# Patient Record
Sex: Male | Born: 1990 | Race: Black or African American | Hispanic: No | Marital: Single | Smoking: Never smoker
Health system: Southern US, Community
[De-identification: ages and names within clinical notes are randomized; demographics above are authoritative.]

## PROBLEM LIST (undated history)

## (undated) DIAGNOSIS — C801 Malignant (primary) neoplasm, unspecified: Secondary | ICD-10-CM

---
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
## 2013-06-30 ENCOUNTER — Emergency Department: Payer: Self-pay | Admitting: Emergency Medicine

## 2013-07-09 ENCOUNTER — Emergency Department: Payer: Self-pay

## 2013-07-09 LAB — COMPREHENSIVE METABOLIC PANEL
ALK PHOS: 74 U/L
Albumin: 3.1 g/dL — ABNORMAL LOW (ref 3.4–5.0)
Anion Gap: 5 — ABNORMAL LOW (ref 7–16)
BUN: 15 mg/dL (ref 7–18)
Bilirubin,Total: 1.2 mg/dL — ABNORMAL HIGH (ref 0.2–1.0)
CO2: 29 mmol/L (ref 21–32)
Calcium, Total: 9.2 mg/dL (ref 8.5–10.1)
Chloride: 101 mmol/L (ref 98–107)
Creatinine: 1.24 mg/dL (ref 0.60–1.30)
EGFR (African American): >60
EGFR (Non-African Amer.): >60
Glucose: 108 mg/dL — ABNORMAL HIGH (ref 65–99)
Osmolality: 271 (ref 275–301)
Potassium: 3.5 mmol/L (ref 3.5–5.1)
SGOT(AST): 28 U/L (ref 15–37)
SGPT (ALT): 24 U/L (ref 12–78)
SODIUM: 135 mmol/L — AB (ref 136–145)
Total Protein: 8.5 g/dL — ABNORMAL HIGH (ref 6.4–8.2)

## 2013-07-09 LAB — CBC
HCT: 44.2 % (ref 40.0–52.0)
HGB: 14.8 g/dL (ref 13.0–18.0)
MCH: 34 pg (ref 26.0–34.0)
MCHC: 33.4 g/dL (ref 32.0–36.0)
MCV: 102 fL — ABNORMAL HIGH (ref 80–100)
PLATELETS: 107 10*3/uL — AB (ref 150–440)
RBC: 4.35 10*6/uL — AB (ref 4.40–5.90)
RDW: 12.9 % (ref 11.5–14.5)
WBC: 16.3 10*3/uL — AB (ref 3.8–10.6)

## 2013-07-09 LAB — URINALYSIS, COMPLETE
BILIRUBIN, UR: NEGATIVE
Glucose,UR: NEGATIVE mg/dL (ref 0–75)
KETONE: NEGATIVE
Nitrite: POSITIVE
PH: 6 (ref 4.5–8.0)
RBC,UR: NONE SEEN /HPF (ref 0–5)
Specific Gravity: 1.009 (ref 1.003–1.030)
WBC UR: 33 /HPF (ref 0–5)

## 2013-07-09 LAB — GC/CHLAMYDIA PROBE AMP

## 2013-07-09 LAB — LIPASE, BLOOD: LIPASE: 46 U/L — AB (ref 73–393)

## 2013-07-11 LAB — BETA STREP CULTURE(ARMC)

## 2013-07-11 LAB — URINE CULTURE

## 2013-07-13 ENCOUNTER — Emergency Department: Payer: Self-pay | Admitting: Emergency Medicine

## 2013-07-13 LAB — CBC
HCT: 41.6 % (ref 40.0–52.0)
HGB: 13.5 g/dL (ref 13.0–18.0)
MCH: 32.9 pg (ref 26.0–34.0)
MCHC: 32.4 g/dL (ref 32.0–36.0)
MCV: 102 fL — ABNORMAL HIGH (ref 80–100)
PLATELETS: 164 10*3/uL (ref 150–440)
RBC: 4.09 10*6/uL — ABNORMAL LOW (ref 4.40–5.90)
RDW: 13.3 % (ref 11.5–14.5)
WBC: 7.5 10*3/uL (ref 3.8–10.6)

## 2013-07-13 LAB — COMPREHENSIVE METABOLIC PANEL
ALBUMIN: 2.7 g/dL — AB (ref 3.4–5.0)
ANION GAP: 6 — AB (ref 7–16)
Alkaline Phosphatase: 79 U/L
BUN: 7 mg/dL (ref 7–18)
Bilirubin,Total: 0.9 mg/dL (ref 0.2–1.0)
Calcium, Total: 9 mg/dL (ref 8.5–10.1)
Chloride: 102 mmol/L (ref 98–107)
Co2: 30 mmol/L (ref 21–32)
Creatinine: 1.07 mg/dL (ref 0.60–1.30)
EGFR (African American): >60
EGFR (Non-African Amer.): >60
Glucose: 79 mg/dL (ref 65–99)
Osmolality: 273 (ref 275–301)
Potassium: 3.1 mmol/L — ABNORMAL LOW (ref 3.5–5.1)
SGOT(AST): 43 U/L — ABNORMAL HIGH (ref 15–37)
SGPT (ALT): 37 U/L (ref 12–78)
SODIUM: 138 mmol/L (ref 136–145)
Total Protein: 8.2 g/dL (ref 6.4–8.2)

## 2013-07-13 LAB — URINALYSIS, COMPLETE
BLOOD: NEGATIVE
Bacteria: NONE SEEN
Bilirubin,UR: NEGATIVE
Glucose,UR: NEGATIVE mg/dL (ref 0–75)
KETONE: NEGATIVE
Leukocyte Esterase: NEGATIVE
Nitrite: NEGATIVE
Ph: 6 (ref 4.5–8.0)
Protein: NEGATIVE
RBC,UR: 1 /HPF (ref 0–5)
SQUAMOUS EPITHELIAL: NONE SEEN
Specific Gravity: 1.015 (ref 1.003–1.030)

## 2013-12-24 ENCOUNTER — Emergency Department: Payer: Self-pay | Admitting: Emergency Medicine

## 2014-02-05 ENCOUNTER — Emergency Department: Payer: Self-pay | Admitting: Emergency Medicine

## 2016-02-23 IMAGING — CT CT ABD-PELV W/ CM
2 of 4 series · 16 of 46 positions shown, 18 images · IV contrast (isovue)
Comparison: None.

CLINICAL DATA: Right lower quadrant pain and right flank pain for 3
days with elevated white blood cell count.

EXAM:
CT ABDOMEN AND PELVIS WITH CONTRAST
TECHNIQUE: Multidetector CT imaging of the abdomen and pelvis was performed
using the standard protocol following bolus administration of
intravenous contrast.
CONTRAST:  100 mL Isovue 370 IV

[Series 2: routine abd pel with · axial · 0.66mm/px · z∈[-1595,-1155]mm · 13 of 96 slices shown, 15 images]
[im 4/96  soft-tissue]
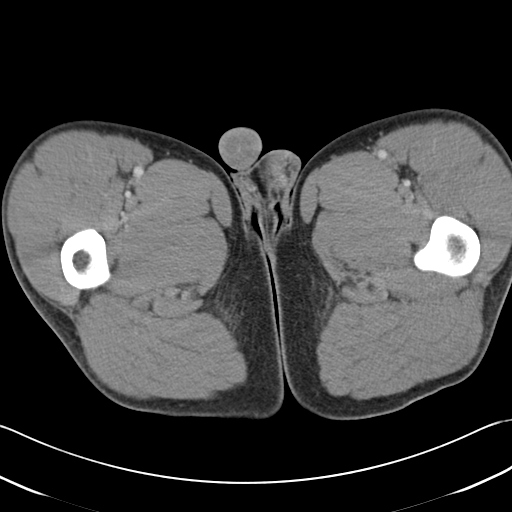
[im 4/96  bone]
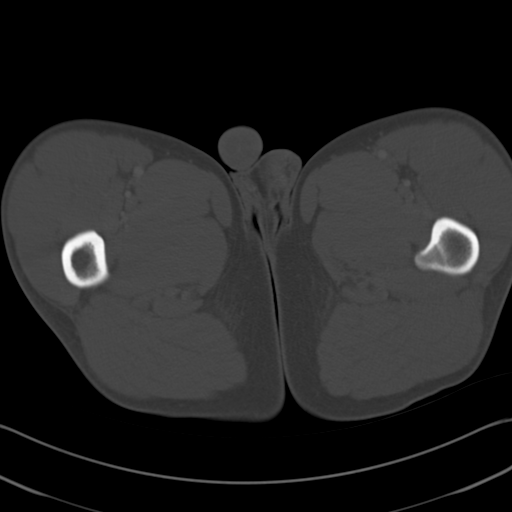
[im 12/96  soft-tissue]
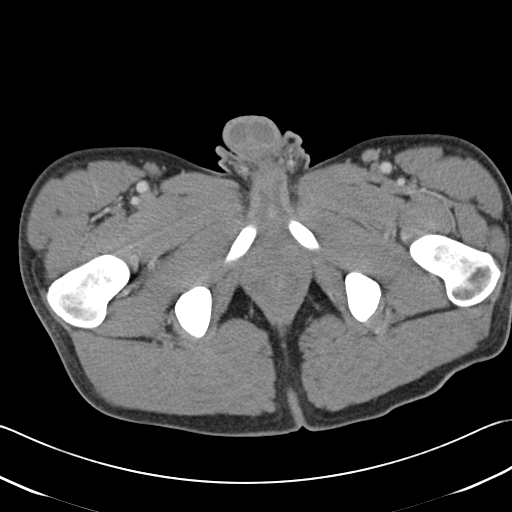
[im 20/96  soft-tissue]
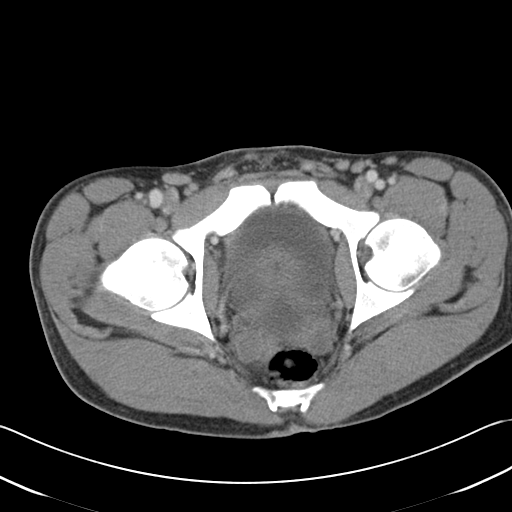
[im 27/96  soft-tissue]
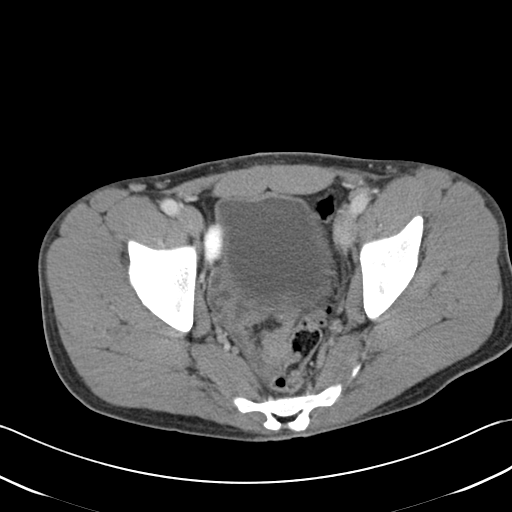
[im 35/96  soft-tissue]
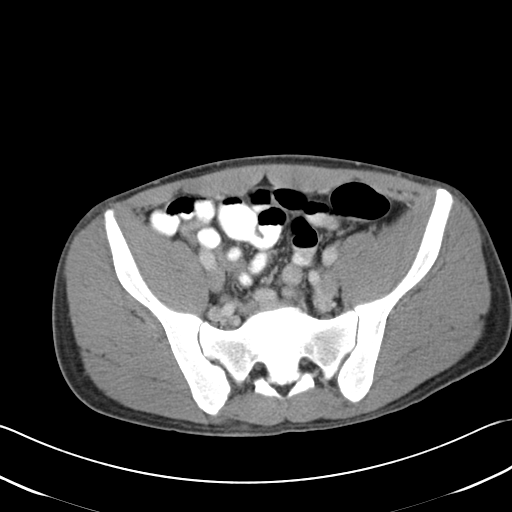
[im 42/96  soft-tissue]
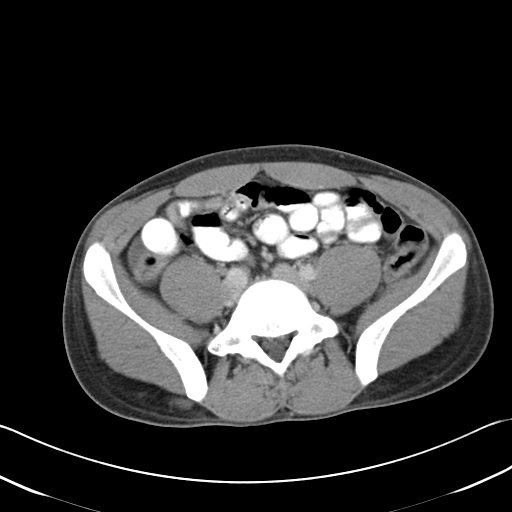
[im 50/96  soft-tissue]
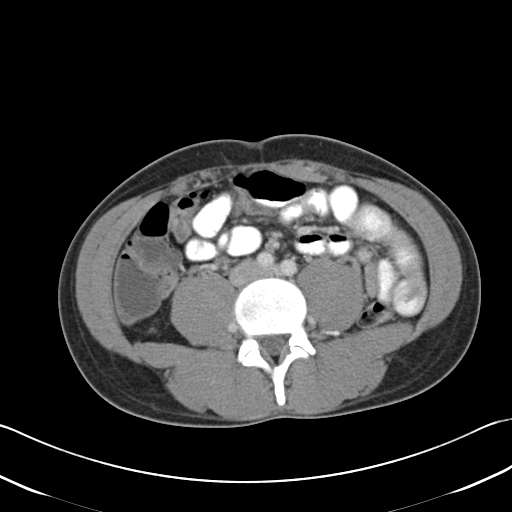
[im 54/96  soft-tissue]
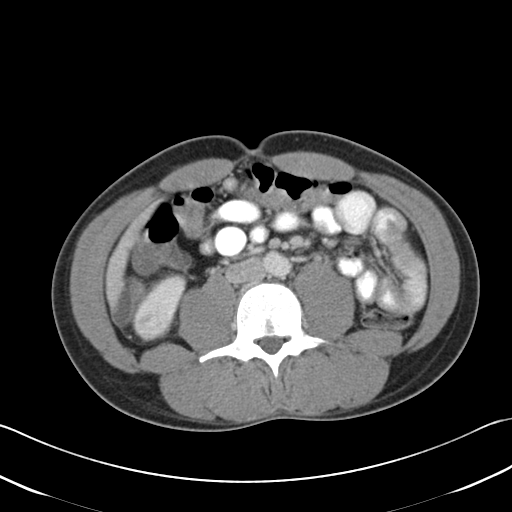
[im 61/96  soft-tissue]
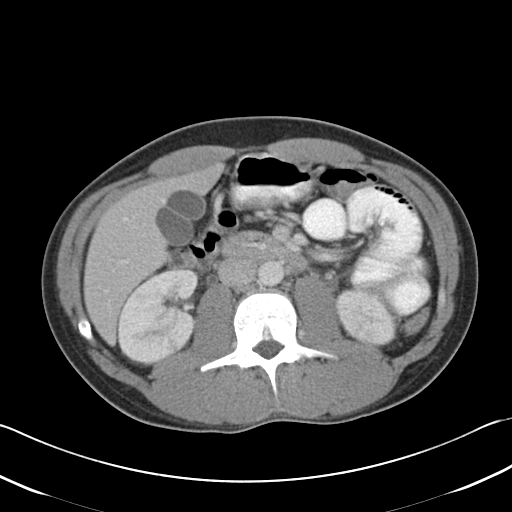
[im 61/96  bone]
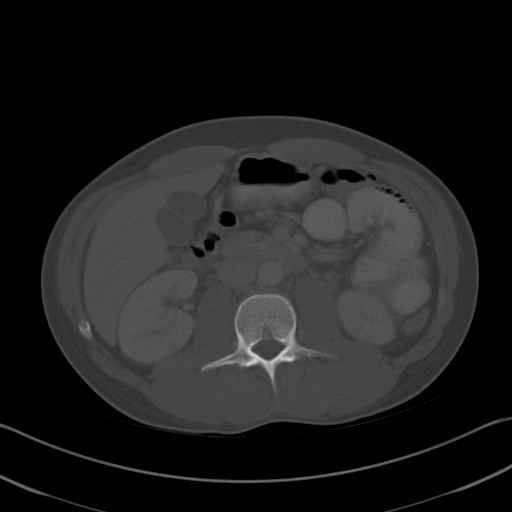
[im 69/96  soft-tissue]
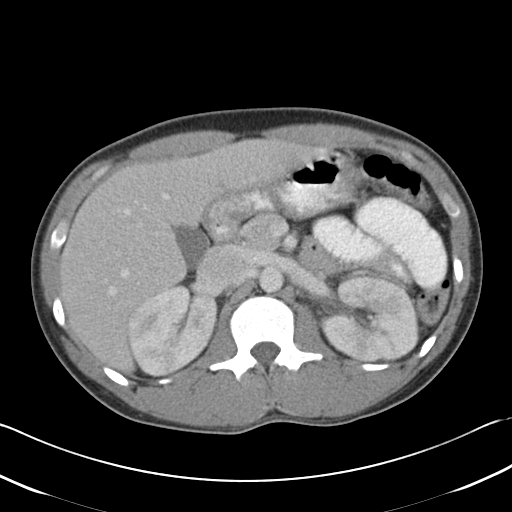
[im 77/96  soft-tissue]
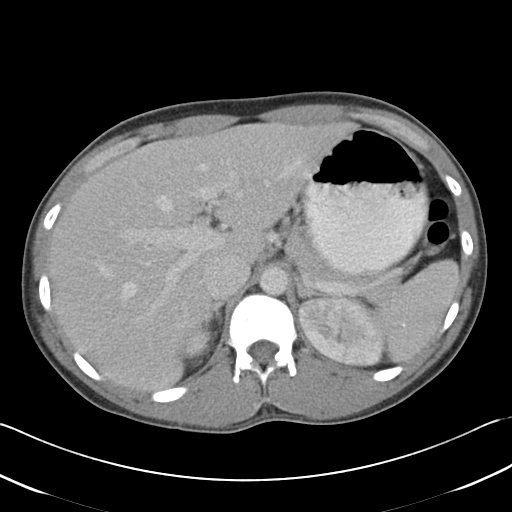
[im 84/96  soft-tissue]
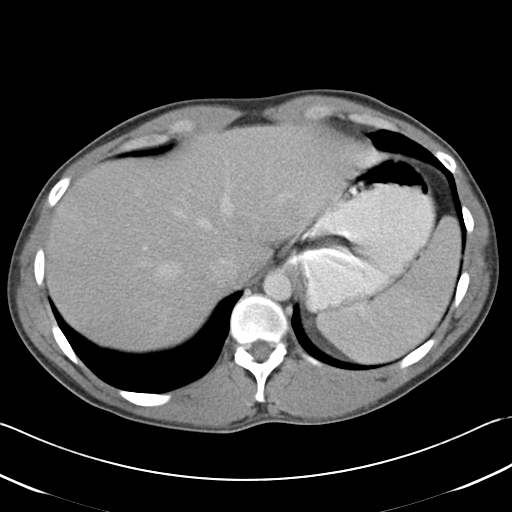
[im 92/96  soft-tissue]
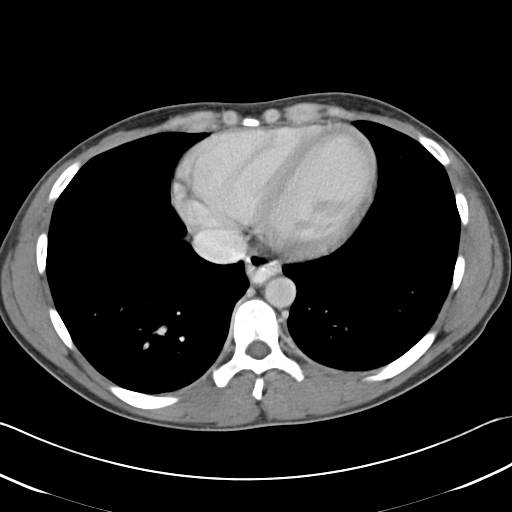

[Series 5: cor routine abd pel with · coronal · 0.72mm/px · 3 of 114 slices shown]
[im 38/114  soft-tissue]
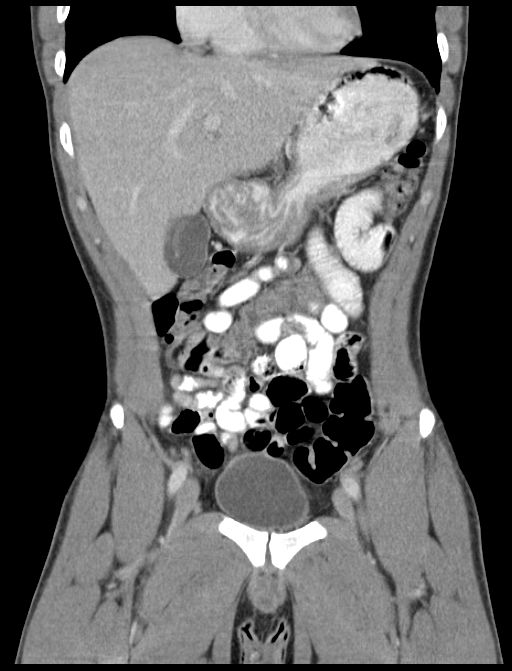
[im 51/114  soft-tissue]
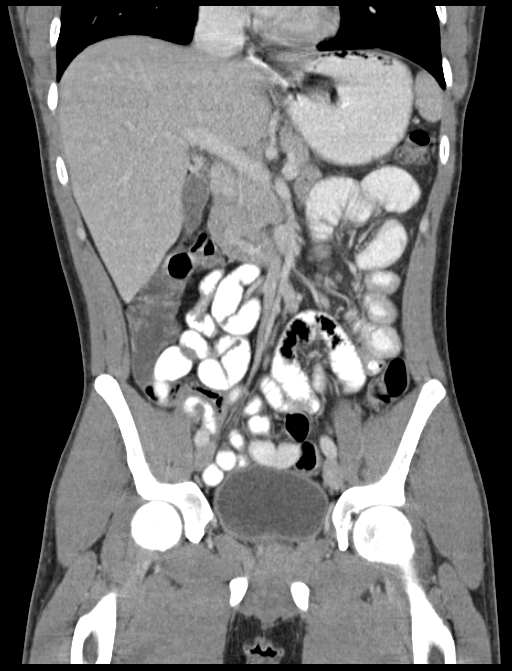
[im 63/114  soft-tissue]
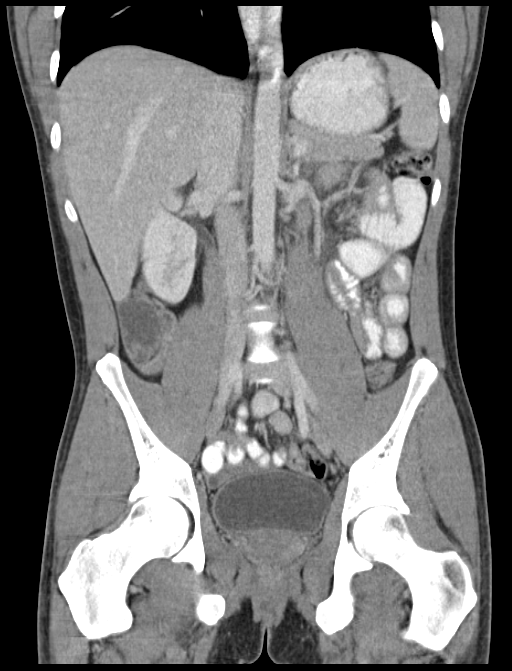

[16 of 46 positions shown; findings below may reference images not displayed]

FINDINGS: Lung bases are normal.

Abdominal images demonstrate a calcified granuloma over the spleen.
The liver, gallbladder, pancreas and adrenal glands are normal.
Appendix is is normal in appearance although extends superiorly with
tip posterior to the gallbladder. There is subtle nonspecific wall
thickening involving a few small bowel loops in the left mid abdomen
with subtle dilatation of the small bowel loops immediately proximal
to this area. There is no free fluid or focal inflammatory change.

The kidneys are normal in size without evidence of hydronephrosis or
nephrolithiasis. There is patchy ill-defined and streaky
low-attenuation of the renal cortex bilaterally which can be seen
due to infection/pyelonephritis. No significant perinephric
inflammation or fluid. Ureters are unremarkable.

Pelvic images demonstrate a small amount of free fluid and are
otherwise unremarkable. Remaining bones soft tissues are within
normal.
IMPRESSION: Normal size kidneys without hydronephrosis or nephrolithiasis. There
are ill-defined regions of low attenuation and streaky hypodensity
within the renal cortex bilaterally which may be due to infection/
pyelonephritis. No significant perinephric inflammation or fluid.

Subtle wall thickening of several small bowel loops in the left mid
abdomen with mild prominence of the small bowel loops immediately
proximal to this region. These findings may be secondary to ileus
versus mild regional enteritis of inflammatory or infectious
etiology.

Minimal nonspecific free fluid in the pelvis.

## 2017-01-06 DIAGNOSIS — D75A Glucose-6-phosphate dehydrogenase (G6PD) deficiency without anemia: Secondary | ICD-10-CM | POA: Insufficient documentation

## 2017-02-13 DIAGNOSIS — G894 Chronic pain syndrome: Secondary | ICD-10-CM | POA: Diagnosis present

## 2021-01-15 DIAGNOSIS — I10 Essential (primary) hypertension: Secondary | ICD-10-CM | POA: Insufficient documentation

## 2022-05-07 ENCOUNTER — Emergency Department: Payer: Medicaid - Out of State

## 2022-05-07 ENCOUNTER — Encounter: Payer: Self-pay | Admitting: Emergency Medicine

## 2022-05-07 ENCOUNTER — Inpatient Hospital Stay
Admission: EM | Admit: 2022-05-07 | Discharge: 2022-05-09 | DRG: 975 | Disposition: A | Discharge status: Home / Self Care | Payer: Medicaid - Out of State | Attending: Internal Medicine | Admitting: Internal Medicine

## 2022-05-07 ENCOUNTER — Other Ambulatory Visit: Payer: Self-pay

## 2022-05-07 DIAGNOSIS — E43 Unspecified severe protein-calorie malnutrition: Secondary | ICD-10-CM | POA: Diagnosis present

## 2022-05-07 DIAGNOSIS — R6510 Systemic inflammatory response syndrome (SIRS) of non-infectious origin without acute organ dysfunction: Secondary | ICD-10-CM | POA: Diagnosis present

## 2022-05-07 DIAGNOSIS — R7989 Other specified abnormal findings of blood chemistry: Secondary | ICD-10-CM | POA: Diagnosis present

## 2022-05-07 DIAGNOSIS — E871 Hypo-osmolality and hyponatremia: Secondary | ICD-10-CM | POA: Diagnosis present

## 2022-05-07 DIAGNOSIS — Z21 Asymptomatic human immunodeficiency virus [HIV] infection status: Secondary | ICD-10-CM | POA: Diagnosis present

## 2022-05-07 DIAGNOSIS — Z681 Body mass index (BMI) 19 or less, adult: Secondary | ICD-10-CM

## 2022-05-07 DIAGNOSIS — J189 Pneumonia, unspecified organism: Secondary | ICD-10-CM | POA: Diagnosis present

## 2022-05-07 DIAGNOSIS — E46 Unspecified protein-calorie malnutrition: Secondary | ICD-10-CM | POA: Diagnosis present

## 2022-05-07 DIAGNOSIS — R000 Tachycardia, unspecified: Secondary | ICD-10-CM

## 2022-05-07 DIAGNOSIS — A419 Sepsis, unspecified organism: Principal | ICD-10-CM | POA: Diagnosis present

## 2022-05-07 DIAGNOSIS — R079 Chest pain, unspecified: Secondary | ICD-10-CM

## 2022-05-07 DIAGNOSIS — C7951 Secondary malignant neoplasm of bone: Secondary | ICD-10-CM | POA: Diagnosis present

## 2022-05-07 DIAGNOSIS — B20 Human immunodeficiency virus [HIV] disease: Secondary | ICD-10-CM | POA: Diagnosis present

## 2022-05-07 DIAGNOSIS — I4510 Unspecified right bundle-branch block: Secondary | ICD-10-CM | POA: Diagnosis present

## 2022-05-07 DIAGNOSIS — C469 Kaposi's sarcoma, unspecified: Secondary | ICD-10-CM | POA: Diagnosis present

## 2022-05-07 DIAGNOSIS — E440 Moderate protein-calorie malnutrition: Secondary | ICD-10-CM | POA: Diagnosis present

## 2022-05-07 DIAGNOSIS — Z1152 Encounter for screening for COVID-19: Secondary | ICD-10-CM

## 2022-05-07 DIAGNOSIS — Z8701 Personal history of pneumonia (recurrent): Secondary | ICD-10-CM

## 2022-05-07 LAB — CBC WITH DIFFERENTIAL/PLATELET
Abs Immature Granulocytes: 0.42 10*3/uL — ABNORMAL HIGH (ref 0.00–0.07)
Basophils Absolute: 0 10*3/uL (ref 0.0–0.1)
Basophils Relative: 0 %
Eosinophils Absolute: 0 10*3/uL (ref 0.0–0.5)
Eosinophils Relative: 0 %
HCT: 43.9 % (ref 39.0–52.0)
Hemoglobin: 13 g/dL (ref 13.0–17.0)
Immature Granulocytes: 5 %
Lymphocytes Relative: 19 %
Lymphs Abs: 1.8 10*3/uL (ref 0.7–4.0)
MCH: 26.2 pg (ref 26.0–34.0)
MCHC: 29.6 g/dL — ABNORMAL LOW (ref 30.0–36.0)
MCV: 88.3 fL (ref 80.0–100.0)
Monocytes Absolute: 0.6 10*3/uL (ref 0.1–1.0)
Monocytes Relative: 7 %
Neutro Abs: 6.5 10*3/uL (ref 1.7–7.7)
Neutrophils Relative %: 69 %
Platelets: 148 10*3/uL — ABNORMAL LOW (ref 150–400)
RBC: 4.97 MIL/uL (ref 4.22–5.81)
RDW: 21.8 % — ABNORMAL HIGH (ref 11.5–15.5)
Smear Review: NORMAL
WBC: 9.4 10*3/uL (ref 4.0–10.5)
nRBC: 0.7 % — ABNORMAL HIGH (ref 0.0–0.2)

## 2022-05-07 LAB — URINALYSIS, ROUTINE W REFLEX MICROSCOPIC
Bacteria, UA: NONE SEEN
Glucose, UA: NEGATIVE mg/dL
Hgb urine dipstick: NEGATIVE
Ketones, ur: 20 mg/dL — AB
Leukocytes,Ua: NEGATIVE
Nitrite: NEGATIVE
Protein, ur: 100 mg/dL — AB
Specific Gravity, Urine: 1.027 (ref 1.005–1.030)
pH: 5 (ref 5.0–8.0)

## 2022-05-07 LAB — COMPREHENSIVE METABOLIC PANEL
ALT: 32 U/L (ref 0–44)
AST: 121 U/L — ABNORMAL HIGH (ref 15–41)
Albumin: 2.7 g/dL — ABNORMAL LOW (ref 3.5–5.0)
Alkaline Phosphatase: 216 U/L — ABNORMAL HIGH (ref 38–126)
Anion gap: 12 (ref 5–15)
BUN: 17 mg/dL (ref 6–20)
CO2: 23 mmol/L (ref 22–32)
Calcium: 8.1 mg/dL — ABNORMAL LOW (ref 8.9–10.3)
Chloride: 95 mmol/L — ABNORMAL LOW (ref 98–111)
Creatinine, Ser: 1.05 mg/dL (ref 0.61–1.24)
GFR, Estimated: >60 mL/min (ref >60)
Glucose, Bld: 89 mg/dL (ref 70–99)
Potassium: 4 mmol/L (ref 3.5–5.1)
Sodium: 130 mmol/L — ABNORMAL LOW (ref 135–145)
Total Bilirubin: 1.5 mg/dL — ABNORMAL HIGH (ref 0.3–1.2)
Total Protein: 11.4 g/dL — ABNORMAL HIGH (ref 6.5–8.1)

## 2022-05-07 LAB — RESP PANEL BY RT-PCR (RSV, FLU A&B, COVID)  RVPGX2
Influenza A by PCR: NEGATIVE
Influenza B by PCR: NEGATIVE
Resp Syncytial Virus by PCR: NEGATIVE
SARS Coronavirus 2 by RT PCR: NEGATIVE

## 2022-05-07 LAB — TROPONIN I (HIGH SENSITIVITY)
Troponin I (High Sensitivity): 7 ng/L (ref <18)
Troponin I (High Sensitivity): 6 ng/L (ref <18)

## 2022-05-07 LAB — LACTIC ACID, PLASMA: Lactic Acid, Venous: 1.8 mmol/L (ref 0.5–1.9)

## 2022-05-07 LAB — PROCALCITONIN: Procalcitonin: 0.32 ng/mL

## 2022-05-07 LAB — GROUP A STREP BY PCR: Group A Strep by PCR: NOT DETECTED

## 2022-05-07 MED ORDER — POTASSIUM CHLORIDE CRYS ER 20 MEQ PO TBCR
20.0000 meq | EXTENDED_RELEASE_TABLET | Freq: Once | ORAL | Status: AC
  Administered 2022-05-08: 20 meq via ORAL
  Filled 2022-05-07: qty 1

## 2022-05-07 MED ORDER — MAGNESIUM OXIDE -MG SUPPLEMENT 400 (240 MG) MG PO TABS
400.0000 mg | ORAL_TABLET | Freq: Every day | ORAL | Status: DC
  Administered 2022-05-08 – 2022-05-09 (×2): 400 mg via ORAL
  Filled 2022-05-07 (×2): qty 1

## 2022-05-07 MED ORDER — ENOXAPARIN SODIUM 40 MG/0.4ML IJ SOSY
40.0000 mg | PREFILLED_SYRINGE | INTRAMUSCULAR | Status: DC
  Administered 2022-05-08 – 2022-05-09 (×2): 40 mg via SUBCUTANEOUS
  Filled 2022-05-07 (×2): qty 0.4

## 2022-05-07 MED ORDER — MORPHINE SULFATE (PF) 2 MG/ML IV SOLN
2.0000 mg | INTRAVENOUS | Status: DC | PRN
  Administered 2022-05-08 (×5): 2 mg via INTRAVENOUS
  Filled 2022-05-07 (×5): qty 1

## 2022-05-07 MED ORDER — ACETAMINOPHEN 325 MG PO TABS: 650.0000 mg | ORAL_TABLET | Freq: Four times a day (QID) | ORAL | Status: DC | PRN

## 2022-05-07 MED ORDER — ONDANSETRON HCL 4 MG PO TABS: 4.0000 mg | ORAL_TABLET | Freq: Four times a day (QID) | ORAL | Status: DC | PRN

## 2022-05-07 MED ORDER — ACETAMINOPHEN 650 MG RE SUPP: 650.0000 mg | Freq: Four times a day (QID) | RECTAL | Status: DC | PRN

## 2022-05-07 MED ORDER — SODIUM CHLORIDE 0.9 % IV BOLUS
1000.0000 mL | Freq: Once | INTRAVENOUS | Status: AC
  Administered 2022-05-07: 1000 mL via INTRAVENOUS

## 2022-05-07 MED ORDER — ALBUTEROL SULFATE (2.5 MG/3ML) 0.083% IN NEBU: 2.5000 mg | INHALATION_SOLUTION | RESPIRATORY_TRACT | Status: DC | PRN

## 2022-05-07 MED ORDER — IOHEXOL 350 MG/ML SOLN
75.0000 mL | Freq: Once | INTRAVENOUS | Status: AC | PRN
  Administered 2022-05-07: 75 mL via INTRAVENOUS

## 2022-05-07 MED ORDER — KETOROLAC TROMETHAMINE 30 MG/ML IJ SOLN
30.0000 mg | Freq: Four times a day (QID) | INTRAMUSCULAR | Status: DC | PRN
  Administered 2022-05-08: 30 mg via INTRAVENOUS
  Filled 2022-05-07: qty 1

## 2022-05-07 MED ORDER — BICTEGRAVIR-EMTRICITAB-TENOFOV 50-200-25 MG PO TABS
1.0000 | ORAL_TABLET | Freq: Every day | ORAL | Status: DC
  Administered 2022-05-08 – 2022-05-09 (×2): 1 via ORAL
  Filled 2022-05-07 (×2): qty 1

## 2022-05-07 MED ORDER — HYDROCODONE-ACETAMINOPHEN 5-325 MG PO TABS
1.0000 | ORAL_TABLET | ORAL | Status: DC | PRN
  Administered 2022-05-08 – 2022-05-09 (×2): 2 via ORAL
  Filled 2022-05-07 (×3): qty 2

## 2022-05-07 MED ORDER — MORPHINE SULFATE (PF) 4 MG/ML IV SOLN
4.0000 mg | Freq: Once | INTRAVENOUS | Status: AC
  Administered 2022-05-07: 4 mg via INTRAVENOUS
  Filled 2022-05-07: qty 1

## 2022-05-07 MED ORDER — SODIUM CHLORIDE 0.9 % IV SOLN: INTRAVENOUS | Status: AC

## 2022-05-07 MED ORDER — LIDOCAINE 5 % EX PTCH
1.0000 | MEDICATED_PATCH | CUTANEOUS | Status: DC
  Administered 2022-05-08 (×2): 1 via TRANSDERMAL
  Filled 2022-05-07 (×2): qty 1

## 2022-05-07 MED ORDER — ONDANSETRON HCL 4 MG/2ML IJ SOLN
4.0000 mg | Freq: Four times a day (QID) | INTRAMUSCULAR | Status: DC | PRN
  Administered 2022-05-08: 4 mg via INTRAVENOUS
  Filled 2022-05-07: qty 2

## 2022-05-07 NOTE — Assessment & Plan Note (Addendum)
Improving.  RUQ ultrasound with no evidence of cholecystitis, mild heterogenicity of the hepatic parenchyma, question periportal edema. Acute hepatitis panel negative

## 2022-05-07 NOTE — H&P (Incomplete)
History and Physical    Patient: Warren Farrell [id number] DOB: Apr 02, 1990 DOA: 05/07/2022 DOS: the patient was seen and examined on 05/07/2022 PCP: Pcp, No  Patient coming from: Home  Chief Complaint:  Chief Complaint  Patient presents with   Chest Pain    HPI: Warren Farrell is a 32 y.o. male with medical history significant for HIV diagnosed in August 2018 diagnosed a few months later with Kaposi's sarcoma and biopsies of skin lesions on thigh and palatal masses currently receiving treatment in [place], records unavailable in [place], who presents to the ED with left-sided chest pain, onset 2 days prior associated with sore throat.  He denies cough, shortness of breath, fever or chills, nausea or vomiting he endorses intermittent upper abdominal pain. ED course and data review: Tmax 99 and tachycardic to the 150s improving only to 122 with hydration, tachypneic To 32 with O2 sat high 90s to 100% on room air.  Respiratory viral panel negative.  CBC and CMP: AST 121, alk phos 216 total bili 1.5 , mild hyponatremia of 130.  Urinalysis unremarkable.  Procalcitonin pending.  Strep test negative. EKG, personally viewed and interpreted showing sinus tachycardia at 158 with incomplete RBBB. CTA chest negative for PE or aortic dissection but showing extensive chronic appearing findings which could be postinfectious or postinflammatory scarring, mild pleural effusion, scattered lytic lesions concerning for metastatic disease, as outlined below: IMPRESSION: 1. Negative for acute pulmonary embolus or aortic dissection. 2. Extensive upper lobe predominant bronchiectasis with upper lobe blebs or emphysema. Fairly widespread irregular and bandlike densities within the upper lobes possibly secondary to post infectious or post inflammatory scarring. There are however additional areas of bilateral irregular ground-glass density and nodularity with suspicion of peribronchovascular  nodularity in the right middle and bilateral lower lobes; findings could be infectious, inflammatory, or malignant in etiology (?history of Kaposi's sarcoma). Mild hyperdense pleural effusions versus pleural metastatic disease. 3. Dominant subcarinal mass suspicious for metastatic disease versus lymphoproliferative disease/lymphoma. 4. Scattered lytic lesions in the sternum and spine concerning for metastatic disease  Patient treated with an IV fluid bolus given morphine for pain.  Hospitalist consulted for admission.   Review of Systems: As mentioned in the history of present illness. All other systems reviewed and are negative.  History reviewed. No pertinent past medical history. History reviewed. No pertinent surgical history. Social History:  reports that he has never smoked. He has never used smokeless tobacco. No history on file for alcohol use and drug use.  No Known Allergies  History reviewed. No pertinent family history.  Prior to Admission medications   Not on File    Physical Exam: Vitals:   05/07/22 1936 05/07/22 2031 05/07/22 2130 05/07/22 2300  BP: 108/77 114/85 113/75 113/74  Pulse: (!) 108 (!) 113 (!) 122 95  Resp: 19 (!) 21 (!) 32 (!) 23  Temp: 99 F (37.2 C)     TempSrc: Oral     SpO2: 100% 100% 100% 100%  Weight:      Height:       Physical Exam  Labs on Admission: I have personally reviewed following labs and imaging studies  CBC: Recent Labs  Lab 05/07/22 1632  WBC 9.4  NEUTROABS 6.5  HGB 13.0  HCT 43.9  MCV 88.3  PLT 123456*   Basic Metabolic Panel: Recent Labs  Lab 05/07/22 1632  NA 130*  K 4.0  CL 95*  CO2 23  GLUCOSE 89  BUN 17  CREATININE 1.05  CALCIUM 8.1*   GFR: Estimated Creatinine Clearance: 81 mL/min (by C-G formula based on SCr of 1.05 mg/dL). Liver Function Tests: Recent Labs  Lab 05/07/22 1632  AST 121*  ALT 32  ALKPHOS 216*  BILITOT 1.5*  PROT 11.4*  ALBUMIN 2.7*   No results for input(s): "LIPASE",  "AMYLASE" in the last 168 hours. No results for input(s): "AMMONIA" in the last 168 hours. Coagulation Profile: No results for input(s): "INR", "PROTIME" in the last 168 hours. Cardiac Enzymes: No results for input(s): "CKTOTAL", "CKMB", "CKMBINDEX", "TROPONINI" in the last 168 hours. BNP (last 3 results) No results for input(s): "PROBNP" in the last 8760 hours. HbA1C: No results for input(s): "HGBA1C" in the last 72 hours. CBG: No results for input(s): "GLUCAP" in the last 168 hours. Lipid Profile: No results for input(s): "CHOL", "HDL", "LDLCALC", "TRIG", "CHOLHDL", "LDLDIRECT" in the last 72 hours. Thyroid Function Tests: No results for input(s): "TSH", "T4TOTAL", "FREET4", "T3FREE", "THYROIDAB" in the last 72 hours. Anemia Panel: No results for input(s): "VITAMINB12", "FOLATE", "FERRITIN", "TIBC", "IRON", "RETICCTPCT" in the last 72 hours. Urine analysis:    Component Value Date/Time   COLORURINE AMBER (A) 05/07/2022 2032   APPEARANCEUR HAZY (A) 05/07/2022 2032   APPEARANCEUR Clear 07/13/2013 0923   LABSPEC 1.027 05/07/2022 2032   LABSPEC 1.015 07/13/2013 0923   PHURINE 5.0 05/07/2022 2032   GLUCOSEU NEGATIVE 05/07/2022 2032   GLUCOSEU Negative 07/13/2013 0923   HGBUR NEGATIVE 05/07/2022 2032   BILIRUBINUR SMALL (A) 05/07/2022 2032   BILIRUBINUR Negative 07/13/2013 0923   KETONESUR 20 (A) 05/07/2022 2032   PROTEINUR 100 (A) 05/07/2022 2032   NITRITE NEGATIVE 05/07/2022 2032   LEUKOCYTESUR NEGATIVE 05/07/2022 2032   LEUKOCYTESUR Negative 07/13/2013 0923    Radiological Exams on Admission: CT Angio Chest PE W and/or Wo Contrast  Result Date: 05/07/2022 CLINICAL DATA:  Chest pain short of breath EXAM: CT ANGIOGRAPHY CHEST WITH CONTRAST TECHNIQUE: Multidetector CT imaging of the chest was performed using the standard protocol during bolus administration of intravenous contrast. Multiplanar CT image reconstructions and MIPs were obtained to evaluate the vascular anatomy.  RADIATION DOSE REDUCTION: This exam was performed according to the departmental dose-optimization program which includes automated exposure control, adjustment of the mA and/or kV according to patient size and/or use of iterative reconstruction technique. CONTRAST:  20mL OMNIPAQUE IOHEXOL 350 MG/ML SOLN COMPARISON:  Chest x-ray 05/07/2022, CT 07/09/2013 FINDINGS: Cardiovascular: Satisfactory opacification of the pulmonary arteries to the segmental level. No evidence of pulmonary embolism. Nonaneurysmal aorta. No dissection is seen. Normal cardiac size. No pericardial effusion. Mediastinum/Nodes: Midline trachea. No thyroid mass. Esophagus within normal limits. Subcarinal mass measuring 3.4 cm AP by 5.2 cm craniocaudad. Lungs/Pleura: Small bilateral slightly dense pleural effusions versus pleural disease. Mildly enlarged axillary nodes, on the right measuring up to 12 mm and on the left measuring up to 11 mm in the subpectoral region. Fairly extensive upper lobe predominant bronchiectasis. Apical blebs or emphysema. Irregular foci and slightly bandlike densities throughout the upper lobes. Scattered somewhat nodular foci of ground-glass density, for example left lower lobe 8 mm density on series 5, image 55. Suspicion of peribronchovascular nodularity in the right middle lobe and bilateral lower lobes. Upper Abdomen: Diffuse low-density liver.  No acute finding. Musculoskeletal: Scattered lytic/lucent lesions within the sternum and vertebral bodies. Review of the MIP images confirms the above findings. IMPRESSION: 1. Negative for acute pulmonary embolus or aortic dissection. 2. Extensive upper lobe predominant bronchiectasis with upper lobe blebs or emphysema. Fairly widespread irregular and  bandlike densities within the upper lobes possibly secondary to post infectious or post inflammatory scarring. There are however additional areas of bilateral irregular ground-glass density and nodularity with suspicion of  peribronchovascular nodularity in the right middle and bilateral lower lobes; findings could be infectious, inflammatory, or malignant in etiology (?history of Kaposi's sarcoma). Mild hyperdense pleural effusions versus pleural metastatic disease. 3. Dominant subcarinal mass suspicious for metastatic disease versus lymphoproliferative disease/lymphoma. 4. Scattered lytic lesions in the sternum and spine concerning for metastatic disease Emphysema (ICD10-J43.9). Electronically Signed   By: Donavan Foil M.D.   On: 05/07/2022 21:55   DG Chest 2 View  Result Date: 05/07/2022 CLINICAL DATA:  Shortness of breath EXAM: CHEST - 2 VIEW COMPARISON:  X-ray 07/09/2013 FINDINGS: Tiny pleural effusions. Apical pleural thickening. Since the prior there is development of interstitial changes with diffuse lung distortion. Please correlate with any specific history. Additional workup is recommended when appropriate including for interstitial lung disease. Normal cardiopericardial silhouette. No consolidation. IMPRESSION: Tiny pleural effusions. There is interval development of distortion of the lung parenchyma with bulla with interstitial changes and possible bleb formation or bronchiectasis. Please correlate for any known history and dedicated workup with CT when clinically appropriate Electronically Signed   By: Jill Side M.D.   On: 05/07/2022 17:02     Data Reviewed: Relevant notes from primary care and specialist visits, past discharge summaries as available in EHR, including Care Everywhere. 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 Derek Jack [address], [place] [address] [phone], [place]  -Follow HIV RNA and CD4 counts -Recommend ID and oncology consults in the a.m.  Abnormal LFTs AST 121, alk phos 216 total bili 1.5 Right upper quadrant ultrasound Acute hepatitis panel  Hyponatremia Suspect mild dehydration IV hydration with NS and monitor  Protein calorie malnutrition BMI 17 Dietary consult    DVT prophylaxis: Lovenox  Consults: none  Advance Care Planning: FULL CODE  Family Communication: none  Disposition Plan: Back to previous home environment  Severity of Illness: The appropriate patient status for this patient is INPATIENT. Inpatient status is judged to be reasonable and necessary in order to provide the required intensity of service to ensure the patient's safety. The patient's presenting symptoms, physical exam findings, and initial radiographic and laboratory data in the context of their chronic comorbidities is felt to place them at high risk for further clinical  deterioration. Furthermore, it is not  anticipated that the patient will be medically stable for discharge from the hospital within 2 midnights of admission.   * I certify that at the point of admission it is my clinical judgment that the patient will require inpatient hospital care spanning beyond 2 midnights from the point of admission due to high intensity of service, high risk for further deterioration and high frequency of surveillance required.*  Author: Athena Masse, MD 05/07/2022 11:14 PM  For on call review [email or web address].

## 2022-05-07 NOTE — Assessment & Plan Note (Signed)
BMI 17 Dietary consult

## 2022-05-07 NOTE — Assessment & Plan Note (Signed)
Worsening hyponatremia with IV fluid.  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 -Stop IV fluid -Monitor sodium

## 2022-05-07 NOTE — ED Triage Notes (Addendum)
First nurse note: Pt here via [facility] from home with c/o of CP.   97%  HR: 120 98.0  106/76

## 2022-05-07 NOTE — Assessment & Plan Note (Addendum)
HIV/AIDS - Patient presented similarly to 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 Derek Jack [address] [place] [address] [phone], [place]  -Follow HIV RNA and CD4 counts -Recommend ID and oncology consults in the a.m.

## 2022-05-07 NOTE — ED Triage Notes (Signed)
Pt via [facility] from home. Pt c/o generalized chest pain, SOB, and bilateral leg swelling for the past 2 weeks. Denies any cardiac hx. States he does have some nausea and vomiting and states he does feel dehydrated.  Pt has a hx of Kaposi Sarcoma. Last chemo treatment was 3-4 weeks ago. Pt is A&Ox4 and NAD

## 2022-05-07 NOTE — Assessment & Plan Note (Addendum)
Respiratory tract infection, possible pneumonia Patient with persistent tachycardia, low-grade temp to 99, cough and sore throat Strep test negative, respiratory viral panel negative, lactic acid--> 1.8, procalcitonin --->0.32.CTA chest negative for PE or dissection but showing multiple findings related to sarcoma as well as inflammatory and possible infectious disease.  Meet sepsis criteria at this time, - Will treat with Rocephin and azithromycin.  Patient has history of sepsis secondary to pneumonia in December 2023 - Follow HIV RNA and CD4 count

## 2022-05-07 NOTE — ED Provider Notes (Signed)
[facility] Provider Note    Event Date/Time   First MD Initiated Contact with Patient 05/07/22 1950     (approximate)   History   Chest Pain   HPI  Warren Farrell is a 32 y.o. 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  The patient also reports some mild bilateral leg swelling as well as some tingling and numbness to both feet.  He has also had a sore throat.  He reports intermittent upper abdominal discomfort described as a squeezing, but is not having it right now.  I attempted to review the past medical records, however the patient is followed at a hospital in [place], [place] and the records do not connect in [place] so I am unable to view prior records.   Physical Exam   Triage Vital Signs: ED Triage Vitals  Enc Vitals Group     BP 05/07/22 1623 (!) 167/86     Pulse Rate 05/07/22 1623 (!) 154     Resp 05/07/22 1623 20     Temp 05/07/22 1623 98.5 F (36.9 C)     Temp Source 05/07/22 1623 Oral     SpO2 05/07/22 1623 98 %     Weight 05/07/22 1619 126 lb (57.2 kg)     Height 05/07/22 1619 6' (1.829 m)     Head Circumference --      Peak Flow --      Pain Score 05/07/22 1618 9     Pain Loc --      Pain Edu? --      Excl. in [place]? --     Most recent vital signs: Vitals:   05/07/22 2130 05/07/22 2300  BP: 113/75 113/74  Pulse: (!) 122 95  Resp: (!) 32 (!) 23  Temp:    SpO2: 100% 100%    General: Alert and oriented, somewhat frail appearing but in no distress.  CV:  Good peripheral perfusion.  Tachycardic, normal heart sounds. Resp:  Normal effort.  Somewhat diminished breath sounds bilaterally with no wheezes or rales. Abd:  Soft and nontender.  No distention.  Other:  No  peripheral edema.  Oropharynx with no erythema or exudate.   ED Results / Procedures / Treatments   Labs (all labs ordered are listed, but only abnormal results are displayed) Labs Reviewed  COMPREHENSIVE METABOLIC PANEL - Abnormal; Notable for the following components:      Result Value   Sodium 130 (*)    Chloride 95 (*)    Calcium 8.1 (*)    Total Protein 11.4 (*)    Albumin 2.7 (*)    AST 121 (*)    Alkaline Phosphatase 216 (*)    Total Bilirubin 1.5 (*)    All other components within normal limits  CBC WITH DIFFERENTIAL/PLATELET - Abnormal; Notable for the following components:   MCHC 29.6 (*)    RDW 21.8 (*)    Platelets 148 (*)    nRBC 0.7 (*)    Abs Immature Granulocytes 0.42 (*)    All other components within normal limits  URINALYSIS, ROUTINE W REFLEX MICROSCOPIC - Abnormal; Notable for the following components:   Color, Urine AMBER (*)    APPearance HAZY (*)  Bilirubin Urine SMALL (*)    Ketones, ur 20 (*)    Protein, ur 100 (*)    Non Squamous Epithelial PRESENT (*)    All other components within normal limits  GROUP A STREP BY PCR  RESP PANEL BY RT-PCR (RSV, FLU A&B, COVID)  RVPGX2  LACTIC ACID, PLASMA  PROCALCITONIN  TROPONIN I (HIGH SENSITIVITY)  TROPONIN I (HIGH SENSITIVITY)     EKG  ED ECG REPORT I, Arta Silence, the attending physician, personally viewed and interpreted this ECG.  Date: 05/07/2022 EKG Time: 1620 Rate: 158 Rhythm: Sinus tachycardia QRS Axis: Right axis Intervals: Incomplete RBBB ST/T Wave abnormalities: Nonspecific ST abnormalities Narrative Interpretation: no evidence of acute ischemia    RADIOLOGY  Chest x-ray: I independently viewed and interpreted the images; there is no focal consolidation or edema.  Radiology report indicates as follows:  IMPRESSION: Tiny pleural effusions. There is interval development of distortion of the lung parenchyma with bulla with interstitial changes and possible bleb formation  or bronchiectasis. Please correlate for any known history and dedicated workup with CT when clinically appropriate   CT angio chest:  IMPRESSION:  1. Negative for acute pulmonary embolus or aortic dissection.  2. Extensive upper lobe predominant bronchiectasis with upper lobe  blebs or emphysema. Fairly widespread irregular and bandlike  densities within the upper lobes possibly secondary to post  infectious or post inflammatory scarring. There are however  additional areas of bilateral irregular ground-glass density and  nodularity with suspicion of peribronchovascular nodularity in the  right middle and bilateral lower lobes; findings could be  infectious, inflammatory, or malignant in etiology (?history of  Kaposi's sarcoma). Mild hyperdense pleural effusions versus pleural  metastatic disease.  3. Dominant subcarinal mass suspicious for metastatic disease versus  lymphoproliferative disease/lymphoma.  4. Scattered lytic lesions in the sternum and spine concerning for  metastatic disease    Emphysema (ICD10-J43.9).    PROCEDURES:  Critical Care performed: No  Procedures   MEDICATIONS ORDERED IN ED: Medications  sodium chloride 0.9 % bolus 1,000 mL (0 mLs Intravenous Stopped 05/07/22 2051)  iohexol (OMNIPAQUE) 350 MG/ML injection 75 mL (75 mLs Intravenous Contrast Given 05/07/22 2117)  sodium chloride 0.9 % bolus 1,000 mL (1,000 mLs Intravenous New Bag/Given 05/07/22 2224)  morphine (PF) 4 MG/ML injection 4 mg (4 mg Intravenous Given 05/07/22 2224)     IMPRESSION / MDM / ASSESSMENT AND PLAN / ED COURSE  I reviewed the triage vital signs and the nursing notes.  32 year old male with a history of HIV and Kaposi's sarcoma currently on chemotherapy presents with chest pain for the last few days associated with sore throat, intermittent upper abdominal crampy discomfort, and some tingling in both legs.  The patient was significantly tachycardic when he first arrived to the ED.  Now  several hours later his heart rate is in the 90s although immediately goes to around 110-115 when he starts talking or moving.  Physical exam is otherwise unremarkable for acute findings.  Differential diagnosis includes, but is not limited to, musculoskeletal chest pain, pain related to his malignancy, PE, less likely ACS.  There is no evidence of aortic dissection or other vascular cause.  Differential also includes infection.  Chest x-ray does not show evidence of pneumonia.  Initial troponin is negative.  Lab workup is significant for mildly elevated alkaline phosphatase.  There is no leukocytosis or anemia.  Lactate is normal.  I have ordered a respiratory panel, strep swab, as well as a CT angio of  the chest.  Patient's presentation is most consistent with acute presentation with potential threat to life or bodily function.  The patient is on the cardiac monitor to evaluate for evidence of arrhythmia and/or significant heart rate changes.   ----------------------------------------- 11:22 PM on 05/07/2022 -----------------------------------------  CTA is negative for acute PE.,  However there are significant findings in the lungs and without access to the patient's prior records from [place] it is difficult to differentiate whether these are directly related to the patient's cancer, chronic lung disease, or possible superimposed pneumonia.  The patient has no leukocytosis on the CBC.  Respiratory panel is negative.  I have added a procalcitonin to help to evaluate for possible acute infection.  The patient remains tachycardic despite fluids although his pain is improved.  Given all of these findings I recommended admission and the patient agrees.  I consulted Dr. Damita Dunnings from the hospitalist service; based on her discussion she agrees to admit the patient.  FINAL CLINICAL IMPRESSION(S) / ED DIAGNOSES   Final diagnoses:  Chest pain, unspecified type  Tachycardia     Rx / DC Orders   ED  Discharge Orders     None        Note:  This document was prepared using Dragon voice recognition software and may include unintentional dictation errors.    Arta Silence, MD 05/07/22 2324

## 2022-05-08 ENCOUNTER — Observation Stay: Payer: Medicaid - Out of State

## 2022-05-08 DIAGNOSIS — J988 Other specified respiratory disorders: Secondary | ICD-10-CM | POA: Insufficient documentation

## 2022-05-08 DIAGNOSIS — B20 Human immunodeficiency virus [HIV] disease: Secondary | ICD-10-CM | POA: Diagnosis present

## 2022-05-08 DIAGNOSIS — Z681 Body mass index (BMI) 19 or less, adult: Secondary | ICD-10-CM | POA: Diagnosis not present

## 2022-05-08 DIAGNOSIS — R079 Chest pain, unspecified: Secondary | ICD-10-CM | POA: Diagnosis not present

## 2022-05-08 DIAGNOSIS — R7989 Other specified abnormal findings of blood chemistry: Secondary | ICD-10-CM | POA: Diagnosis present

## 2022-05-08 DIAGNOSIS — A419 Sepsis, unspecified organism: Secondary | ICD-10-CM | POA: Diagnosis present

## 2022-05-08 DIAGNOSIS — E871 Hypo-osmolality and hyponatremia: Secondary | ICD-10-CM | POA: Diagnosis present

## 2022-05-08 DIAGNOSIS — J189 Pneumonia, unspecified organism: Secondary | ICD-10-CM | POA: Diagnosis present

## 2022-05-08 DIAGNOSIS — R6510 Systemic inflammatory response syndrome (SIRS) of non-infectious origin without acute organ dysfunction: Secondary | ICD-10-CM | POA: Diagnosis not present

## 2022-05-08 DIAGNOSIS — C7951 Secondary malignant neoplasm of bone: Secondary | ICD-10-CM | POA: Diagnosis present

## 2022-05-08 DIAGNOSIS — I4510 Unspecified right bundle-branch block: Secondary | ICD-10-CM | POA: Diagnosis present

## 2022-05-08 DIAGNOSIS — E440 Moderate protein-calorie malnutrition: Secondary | ICD-10-CM | POA: Diagnosis present

## 2022-05-08 DIAGNOSIS — Z1152 Encounter for screening for COVID-19: Secondary | ICD-10-CM | POA: Diagnosis not present

## 2022-05-08 DIAGNOSIS — C469 Kaposi's sarcoma, unspecified: Secondary | ICD-10-CM | POA: Diagnosis present

## 2022-05-08 DIAGNOSIS — Z8701 Personal history of pneumonia (recurrent): Secondary | ICD-10-CM | POA: Diagnosis not present

## 2022-05-08 LAB — COMPREHENSIVE METABOLIC PANEL
ALT: 25 U/L (ref 0–44)
AST: 97 U/L — ABNORMAL HIGH (ref 15–41)
Albumin: 2 g/dL — ABNORMAL LOW (ref 3.5–5.0)
Alkaline Phosphatase: 157 U/L — ABNORMAL HIGH (ref 38–126)
Anion gap: 4 — ABNORMAL LOW (ref 5–15)
BUN: 12 mg/dL (ref 6–20)
CO2: 22 mmol/L (ref 22–32)
Calcium: 7.2 mg/dL — ABNORMAL LOW (ref 8.9–10.3)
Chloride: 102 mmol/L (ref 98–111)
Creatinine, Ser: 0.9 mg/dL (ref 0.61–1.24)
GFR, Estimated: >60 mL/min (ref >60)
Glucose, Bld: 139 mg/dL — ABNORMAL HIGH (ref 70–99)
Potassium: 3.5 mmol/L (ref 3.5–5.1)
Sodium: 128 mmol/L — ABNORMAL LOW (ref 135–145)
Total Bilirubin: 0.8 mg/dL (ref 0.3–1.2)
Total Protein: 9.1 g/dL — ABNORMAL HIGH (ref 6.5–8.1)

## 2022-05-08 LAB — HEPATITIS PANEL, ACUTE
HCV Ab: NONREACTIVE
Hep A IgM: NONREACTIVE
Hep B C IgM: NONREACTIVE
Hepatitis B Surface Ag: NONREACTIVE

## 2022-05-08 LAB — LACTIC ACID, PLASMA: Lactic Acid, Venous: 1 mmol/L (ref 0.5–1.9)

## 2022-05-08 MED ORDER — SODIUM CHLORIDE 0.9 % IV SOLN
2.0000 g | INTRAVENOUS | Status: DC
  Administered 2022-05-08 – 2022-05-09 (×2): 2 g via INTRAVENOUS
  Filled 2022-05-08 (×2): qty 20

## 2022-05-08 MED ORDER — SODIUM CHLORIDE 0.9 % IV SOLN
500.0000 mg | INTRAVENOUS | Status: DC
  Administered 2022-05-08 – 2022-05-09 (×2): 500 mg via INTRAVENOUS
  Filled 2022-05-08 (×2): qty 5

## 2022-05-08 MED ORDER — LACTATED RINGERS IV SOLN
150.0000 mL/h | INTRAVENOUS | Status: DC
  Administered 2022-05-08: 150 mL/h via INTRAVENOUS

## 2022-05-08 NOTE — Progress Notes (Signed)
CODE SEPSIS - PHARMACY COMMUNICATION  **Broad Spectrum Antibiotics should be administered within 1 hour of Sepsis diagnosis**  Time Code Sepsis Called/Page Received: 0122  Antibiotics Ordered: Azithromycin & Ceftriaxone  Time of 1st antibiotic administration: 0153  Renda Rolls, PharmD, MBA 05/08/2022 1:23 AM

## 2022-05-08 NOTE — Sepsis Progress Note (Signed)
Elink monitoring for the code sepsis protocol.  

## 2022-05-08 NOTE — Assessment & Plan Note (Signed)
-  Follow CD count -Continue Biktarvy

## 2022-05-08 NOTE — Assessment & Plan Note (Addendum)
Bony metastasis on CT chest in the setting of history of Kaposi's sarcoma Possibly related to pneumonia versus metastatic lesion seen on CTA chest CTA negative for PE Troponin 6, cardiac etiology not suspected  -Pain control - Treat pneumonia as outlined above

## 2022-05-08 NOTE — ED Notes (Signed)
Assumed care from [place]. Pt resting comfortably in bed at this time. Pt denies any current needs or questions. Call light with in reach.

## 2022-05-08 NOTE — ED Notes (Signed)
Patient up and out of bed throughout the night to toilet in room. States BM with a mix of diarrhea and "normal" for him. Patient unhooks and then hooks self back to the monitor without incident. Fluids provided per request. CB in reach and no complaints at this time. Will continue to monitor.

## 2022-05-08 NOTE — ED Notes (Signed)
Patient resting comfortably with eyes closed. Respirations even and unlabored NAD noted.

## 2022-05-08 NOTE — Hospital Course (Addendum)
Taken from H&P.  Warren Farrell is a 32 y.o. male with medical history significant for HIV diagnosed in August 2018 diagnosed a few months later with Kaposi's sarcoma and biopsies of skin lesions on thigh and palatal masses currently receiving treatment in [place], records unavailable in [place], who presents to the ED with left-sided chest pain, onset 2 days prior associated with sore throat.   He gets most of his care in [facility] and was hospitalized there in December 2023 for sepsis secondary to pneumonia.  He states he has both an ID specialist and oncologist in [place] and he follows regularly and he is compliant with his [place].. ED course and data review: Tmax 99 and tachycardic to the 150s improving only to 122 with hydration, tachypneic To 32 with O2 sat high 90s to 100% on room air.  Respiratory viral panel negative.  CBC and CMP: AST 121, alk phos 216 total bili 1.5 , mild hyponatremia of 130.  Urinalysis unremarkable.  Procalcitonin pending.  Strep test negative. EKG, personally viewed and interpreted showing sinus tachycardia at 158 with incomplete RBBB. CTA chest negative for PE or aortic dissection but showing extensive chronic appearing findings which could be postinfectious or postinflammatory scarring, mild pleural effusion, scattered lytic lesions concerning for metastatic disease.  4/3: Patient continued to have pleuritic chest pain.  Discussed with patient regarding the concern of disease progression, he wants me to contact his oncologist as he would like to continue at [facility].  He was just visiting here.  Message left for his oncologist. 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  Preliminary blood cultures negative.  4/4: Hemodynamically stable.  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  Patient was also on Zoloft and instructed to hold until sees by  his physician and they can either discontinue and switch with a different antidepressant or continue. Had a discussion with an nurse at his oncologist office and they will see him on 05/14/2022 as his oncologist is currently on vacation.  They instructed to give him the report of his CT scan.  He was provided with CT scan CD and report to take it with him to his own physicians for further management because of the concern of his disease progression.  Patient was given a course of Levaquin for concern of pneumonia.  He will continue with rest of his home medications and need to have a close follow-up with his providers for further care.

## 2022-05-08 NOTE — Progress Notes (Signed)
Progress Note   Patient: Warren Farrell [id number] DOB: 03/24/1990 DOA: 05/07/2022     0 DOS: the patient was seen and examined on 05/08/2022   Brief hospital course: Taken from H&P.  Warren Farrell is a 32 y.o. male with medical history significant for HIV diagnosed in August 2018 diagnosed a few months later with Kaposi's sarcoma and biopsies of skin lesions on thigh and palatal masses currently receiving treatment in [place], records unavailable in [place], who presents to the ED with left-sided chest pain, onset 2 days prior associated with sore throat.   He gets most of his care in [facility] and was hospitalized there in December 2023 for sepsis secondary to pneumonia.  He states he has both an ID specialist and oncologist in [place] and he follows regularly and he is compliant with his [place].. ED course and data review: Tmax 99 and tachycardic to the 150s improving only to 122 with hydration, tachypneic To 32 with O2 sat high 90s to 100% on room air.  Respiratory viral panel negative.  CBC and CMP: AST 121, alk phos 216 total bili 1.5 , mild hyponatremia of 130.  Urinalysis unremarkable.  Procalcitonin pending.  Strep test negative. EKG, personally viewed and interpreted showing sinus tachycardia at 158 with incomplete RBBB. CTA chest negative for PE or aortic dissection but showing extensive chronic appearing findings which could be postinfectious or postinflammatory scarring, mild pleural effusion, scattered lytic lesions concerning for metastatic disease.  4/3: Patient continued to have pleuritic chest pain.  Discussed with patient regarding the concern of disease progression, he wants me to contact his oncologist as he would like to continue at [facility].  He was just visiting here.  Message left for his oncologist. 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  Preliminary blood cultures  negative.   Assessment and Plan: * SIRS, possible sepsis Respiratory tract infection, possible pneumonia Patient with persistent tachycardia, low-grade temp to 99, cough and sore throat Strep test negative, respiratory viral panel negative, lactic acid--> 1.8, procalcitonin --->0.32.CTA chest negative for PE or dissection but showing multiple findings related to sarcoma as well as inflammatory and possible infectious disease.  Meet sepsis criteria at this time, - Will treat with Rocephin and azithromycin.  Patient has history of sepsis secondary to pneumonia in December 2023 - Follow HIV RNA and CD4 count  Chest pain Bony metastasis on CT chest in the setting of history of Kaposi's sarcoma Possibly related to pneumonia versus metastatic lesion seen on CTA chest CTA negative for PE Troponin 6, cardiac etiology not suspected  -Pain control - Treat pneumonia as outlined above  Abnormal LFTs Improving.  RUQ ultrasound with no evidence of cholecystitis, mild heterogenicity of the hepatic parenchyma, question periportal edema. Acute hepatitis panel negative   Kaposi sarcoma HIV/AIDS - Diagnosed with HIV and Kaposi's sarcoma in October 2018.  Tumor positive for positive for HHV-8 -In 2018 patient was treated with liposomal doxorubicin IV 80  -Follows at [facility] cancer center with oncologist Dr. Jodelle Green, [facility] [phone], (records not accessible in [place]) -Follow HIV RNA and CD4 counts - Bradley left for patient's oncologist in [place] as he would like to go back and continue with them  Hyponatremia Worsening hyponatremia with IV fluid.  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 -Stop IV fluid -Monitor sodium  Protein calorie malnutrition BMI 17 Dietary consult  HIV (human immunodeficiency virus infection) -  Follow CD count -Continue Biktarvy   Subjective: Patient was complaining of generalized aches and pains and also pleuritic  central chest pain.  Overall not feeling well.  He was visiting [place] and would like to go back to his hometown soon.  He does not want to involve oncology here and would like to continue with his own oncologist back home.  Physical Exam: Vitals:   05/08/22 1000 05/08/22 1200 05/08/22 1230 05/08/22 1300  BP: 105/69 100/80 104/74 107/83  Pulse: 86 100 79 (!) 102  Resp: (!) 21 20 (!) 25 (!) 23  Temp:    98.6 F (37 C)  TempSrc:      SpO2: 97% 100% 98% 100%  Weight:      Height:       General.  Malnourished gentleman, in no acute distress. Pulmonary.  Lungs clear bilaterally, normal respiratory effort. CV.  Regular rate and rhythm, no JVD, rub or murmur. Abdomen.  Soft, nontender, nondistended, BS positive. CNS.  Alert and oriented .  No focal neurologic deficit. Extremities.  No edema, no cyanosis, pulses intact and symmetrical. Psychiatry.  Judgment and insight appears normal.   Data Reviewed: Prior data reviewed  Family Communication:   Disposition: Status is: Observation The patient will require care spanning > 2 midnights and should be moved to inpatient because: Severity of illness  Planned Discharge Destination: Home  DVT prophylaxis.  Lovenox Time spent: 50 minutes  This record has been created using Systems analyst. Errors have been sought and corrected,but may not always be located. Such creation errors do not reflect on the standard of care.   Author: Lorella Nimrod, MD 05/08/2022 2:13 PM  For on call review [email or web address].

## 2022-05-09 DIAGNOSIS — R6510 Systemic inflammatory response syndrome (SIRS) of non-infectious origin without acute organ dysfunction: Secondary | ICD-10-CM | POA: Diagnosis not present

## 2022-05-09 DIAGNOSIS — C469 Kaposi's sarcoma, unspecified: Secondary | ICD-10-CM | POA: Diagnosis not present

## 2022-05-09 DIAGNOSIS — R079 Chest pain, unspecified: Secondary | ICD-10-CM | POA: Diagnosis not present

## 2022-05-09 DIAGNOSIS — B20 Human immunodeficiency virus [HIV] disease: Secondary | ICD-10-CM

## 2022-05-09 DIAGNOSIS — E871 Hypo-osmolality and hyponatremia: Secondary | ICD-10-CM

## 2022-05-09 DIAGNOSIS — E440 Moderate protein-calorie malnutrition: Secondary | ICD-10-CM

## 2022-05-09 LAB — HELPER T-LYMPH-CD4 (ARMC ONLY)
% CD 4 Pos. Lymph.: 7.5 % — ABNORMAL LOW (ref 30.8–58.5)
Absolute CD 4 Helper: 128 /uL — ABNORMAL LOW (ref 359–1519)
Basophils Absolute: 0 10*3/uL (ref 0.0–0.2)
Basos: 1 %
EOS (ABSOLUTE): 0 10*3/uL (ref 0.0–0.4)
Eos: 0 %
Hematocrit: 34.1 % — ABNORMAL LOW (ref 37.5–51.0)
Hemoglobin: 10.5 g/dL — ABNORMAL LOW (ref 13.0–17.7)
Immature Grans (Abs): 0.2 10*3/uL — ABNORMAL HIGH (ref 0.0–0.1)
Immature Granulocytes: 3 %
Lymphocytes Absolute: 1.7 10*3/uL (ref 0.7–3.1)
Lymphs: 20 %
MCH: 27.1 pg (ref 26.6–33.0)
MCHC: 30.8 g/dL — ABNORMAL LOW (ref 31.5–35.7)
MCV: 88 fL (ref 79–97)
Monocytes Absolute: 0.6 10*3/uL (ref 0.1–0.9)
Monocytes: 7 %
NRBC: 1 % — ABNORMAL HIGH (ref 0–0)
Neutrophils Absolute: 6 10*3/uL (ref 1.4–7.0)
Neutrophils: 69 %
Platelets: 125 10*3/uL — ABNORMAL LOW (ref 150–450)
RBC: 3.88 x10E6/uL — ABNORMAL LOW (ref 4.14–5.80)
RDW: 19.1 % — ABNORMAL HIGH (ref 11.6–15.4)
WBC: 8.6 10*3/uL (ref 3.4–10.8)

## 2022-05-09 LAB — BASIC METABOLIC PANEL
Anion gap: 6 (ref 5–15)
BUN: 7 mg/dL (ref 6–20)
CO2: 24 mmol/L (ref 22–32)
Calcium: 7.5 mg/dL — ABNORMAL LOW (ref 8.9–10.3)
Chloride: 101 mmol/L (ref 98–111)
Creatinine, Ser: 0.78 mg/dL (ref 0.61–1.24)
GFR, Estimated: >60 mL/min (ref >60)
Glucose, Bld: 123 mg/dL — ABNORMAL HIGH (ref 70–99)
Potassium: 3.2 mmol/L — ABNORMAL LOW (ref 3.5–5.1)
Sodium: 131 mmol/L — ABNORMAL LOW (ref 135–145)

## 2022-05-09 LAB — HIV-1 RNA QUANT-NO REFLEX-BLD
HIV 1 RNA Quant: 782000 copies/mL
LOG10 HIV-1 RNA: 5.893 log10copy/mL

## 2022-05-09 MED ORDER — LEVOFLOXACIN 750 MG PO TABS: 750.0000 mg | ORAL_TABLET | Freq: Every day | ORAL | 0 refills | Status: DC

## 2022-05-09 MED ORDER — POTASSIUM CHLORIDE CRYS ER 20 MEQ PO TBCR
40.0000 meq | EXTENDED_RELEASE_TABLET | Freq: Once | ORAL | Status: AC
  Administered 2022-05-09: 40 meq via ORAL
  Filled 2022-05-09: qty 2

## 2022-05-09 MED ORDER — LEVOFLOXACIN 750 MG PO TABS: 750.0000 mg | ORAL_TABLET | Freq: Every day | ORAL | 0 refills | Status: AC

## 2022-05-09 MED ORDER — METOPROLOL SUCCINATE ER 50 MG PO TB24: 50.0000 mg | ORAL_TABLET | Freq: Two times a day (BID) | ORAL | Status: DC

## 2022-05-09 MED ORDER — SERTRALINE HCL 25 MG PO TABS: 25.0000 mg | ORAL_TABLET | Freq: Every day | ORAL | Status: DC

## 2022-05-09 NOTE — ED Notes (Signed)
Report received. Pt noted to be resting quietly in bed with eyes closed. Respirations even and unlabored. Bed in lowest position and locked with bed rails up X 2. No distress noted.

## 2022-05-09 NOTE — ED Notes (Signed)
AVS with prescriptions provided to and discussed with patient. Pt verbalizes understanding of discharge instructions and denies any questions or concerns at this time. Pt ambulated out of department independently with steady gait. ? ?

## 2022-05-09 NOTE — Discharge Summary (Signed)
Physician Discharge Summary   Patient: Warren Farrell MRN: [id number] DOB: 10/19/1990  Admit date:     05/07/2022  Discharge date: 05/09/22  Discharge Physician: Lorella Nimrod   PCP: Pcp, No   Recommendations at discharge:  Please obtain CBC and CMP in 1 week There is a concern of disease progression-follow-up with oncology within next few days, and is on 05/14/2022. Follow-up with primary care provider  Discharge Diagnoses: Principal Problem:   SIRS, possible sepsis Active Problems:   Chest pain   Kaposi sarcoma   Abnormal LFTs   Hyponatremia   Protein calorie malnutrition   HIV (human immunodeficiency virus infection)   Hospital Course: Taken from H&P.  Warren Farrell is a 32 y.o. male with medical history significant for HIV diagnosed in August 2018 diagnosed a few months later with Kaposi's sarcoma and biopsies of skin lesions on thigh and palatal masses currently receiving treatment in [place], records unavailable in [place], who presents to the ED with left-sided chest pain, onset 2 days prior associated with sore throat.   He gets most of his care in [facility] and was hospitalized there in December 2023 for sepsis secondary to pneumonia.  He states he has both an ID specialist and oncologist in [place] and he follows regularly and he is compliant with his [place].. ED course and data review: Tmax 99 and tachycardic to the 150s improving only to 122 with hydration, tachypneic To 32 with O2 sat high 90s to 100% on room air.  Respiratory viral panel negative.  CBC and CMP: AST 121, alk phos 216 total bili 1.5 , mild hyponatremia of 130.  Urinalysis unremarkable.  Procalcitonin pending.  Strep test negative. EKG, personally viewed and interpreted showing sinus tachycardia at 158 with incomplete RBBB. CTA chest negative for PE or aortic dissection but showing extensive chronic appearing findings which could be postinfectious or postinflammatory scarring,  mild pleural effusion, scattered lytic lesions concerning for metastatic disease.  4/3: Patient continued to have pleuritic chest pain.  Discussed with patient regarding the concern of disease progression, he wants me to contact his oncologist as he would like to continue at [facility].  He was just visiting here.  Message left for his oncologist. 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  Preliminary blood cultures negative.  4/4: Hemodynamically stable.  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  Patient was also on Zoloft and instructed to hold until sees by his physician and they can either discontinue and switch with a different antidepressant or continue. Had a discussion with an nurse at his oncologist office and they will see him on 05/14/2022 as his oncologist is currently on vacation.  They instructed to give him the report of his CT scan.  He was provided with CT scan CD and report to take it with him to his own physicians for further management because of the concern of his disease progression.  Patient was given a course of Levaquin for concern of pneumonia.  He will continue with rest of his home medications and need to have a close follow-up with his providers for further care.  Assessment and Plan: * SIRS, possible sepsis Respiratory tract infection, possible pneumonia Patient with persistent tachycardia, low-grade temp to 99, cough and sore throat Strep test negative, respiratory viral panel negative, lactic acid--> 1.8, procalcitonin --->0.32.CTA chest negative for PE or dissection but showing multiple findings related to sarcoma as  well as inflammatory and possible infectious disease.  Meet sepsis criteria at this time, - Will treat with Rocephin and azithromycin.  Patient has history of sepsis secondary to pneumonia in December 2023 - Follow HIV RNA and CD4 count  Chest pain Bony  metastasis on CT chest in the setting of history of Kaposi's sarcoma Possibly related to pneumonia versus metastatic lesion seen on CTA chest CTA negative for PE Troponin 6, cardiac etiology not suspected  -Pain control - Treat pneumonia as outlined above  Abnormal LFTs Improving.  RUQ ultrasound with no evidence of cholecystitis, mild heterogenicity of the hepatic parenchyma, question periportal edema. Acute hepatitis panel negative   Kaposi sarcoma HIV/AIDS - Diagnosed with HIV and Kaposi's sarcoma in October 2018.  Tumor positive for positive for HHV-8 -In 2018 patient was treated with liposomal doxorubicin IV 80  -Follows at [facility] cancer center with oncologist Dr. Jodelle Green, [facility] [phone], (records not accessible in [place]) -Follow HIV RNA and CD4 counts - [place] left for patient's oncologist in [place] as he would like to go back and continue with them  Hyponatremia Worsening hyponatremia with IV fluid.  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 -Stop IV fluid -Monitor sodium  Protein calorie malnutrition BMI 17 Dietary consult  HIV (human immunodeficiency virus infection) -Follow CD count -Continue Biktarvy   Pain control - [place] Controlled Substance Reporting System database was reviewed. and patient was instructed, not to drive, operate heavy machinery, perform activities at heights, swimming or participation in water activities or provide baby-sitting services while on Pain, Sleep and Anxiety Medications; until their outpatient Physician has advised to do so again. Also recommended to not to take more than prescribed Pain, Sleep and Anxiety Medications.  Consultants: None Procedures performed: None Disposition: Home Diet recommendation:  Discharge Diet Orders (From admission, onward)     Start     Ordered   05/09/22 0000  Diet - low sodium heart healthy        05/09/22 1226           Regular  diet DISCHARGE MEDICATION: Allergies as of 05/09/2022   No Known Allergies      Medication List     STOP taking these medications    cefdinir 300 MG capsule Commonly known as: OMNICEF       TAKE these medications    Biktarvy 50-200-25 MG Tabs tablet Generic drug: bictegravir-emtricitabine-tenofovir AF Take 1 tablet by mouth daily.   calcium carbonate 750 MG chewable tablet Commonly known as: TUMS EX Chew 2 tablets by mouth daily.   ibuprofen 400 MG tablet Commonly known as: ADVIL Take 400 mg by mouth every 6 (six) hours as needed.   levofloxacin 750 MG tablet Commonly known as: Levaquin Take 1 tablet (750 mg total) by mouth daily for 7 days.   lidocaine 5 % Commonly known as: LIDODERM Place 1 patch onto the skin daily. Remove & Discard patch within 12 hours or as directed by MD   magnesium oxide 400 (240 Mg) MG tablet Commonly known as: MAG-OX Take 400 mg by mouth daily.   metoprolol succinate 50 MG 24 hr tablet Commonly known as: TOPROL-XL Take 1 tablet (50 mg total) by mouth 2 (two) times daily. Take with or immediately following a meal.  Hold until you see your doctor What changed: additional instructions   ondansetron 4 MG disintegrating tablet Commonly known as: ZOFRAN-ODT Take 4 mg by mouth every 4 (four) hours as needed for nausea or  vomiting.   oxyCODONE 5 MG immediate release tablet Commonly known as: Oxy IR/ROXICODONE Take 5 mg by mouth every 6 (six) hours as needed for severe pain.   pantoprazole 40 MG tablet Commonly known as: PROTONIX Take 40 mg by mouth 2 (two) times daily.   potassium chloride SA 20 MEQ tablet Commonly known as: KLOR-CON M Take 20 mEq by mouth once.   sertraline 25 MG tablet Commonly known as: ZOLOFT Take 1 tablet (25 mg total) by mouth daily. Hold until you see your doctor What changed: additional instructions   sulfamethoxazole-trimethoprim 800-160 MG tablet Commonly known as: BACTRIM DS Take 1 tablet by mouth  daily with breakfast.   Vitamin D (Ergocalciferol) 1.25 MG (50000 UNIT) Caps capsule Commonly known as: DRISDOL Take 50,000 Units by mouth every 7 (seven) days. Patient takes on Tuesdays.        Discharge Exam: Filed Weights   05/07/22 1619  Weight: 57.2 kg   General.  Ill-appearing, malnourished gentleman, in no acute distress. Pulmonary.  Lungs clear bilaterally, normal respiratory effort. CV.  Regular rate and rhythm, no JVD, rub or murmur. Abdomen.  Soft, nontender, nondistended, BS positive. CNS.  Alert and oriented .  No focal neurologic deficit. Extremities.  No edema, no cyanosis, pulses intact and symmetrical. Psychiatry.  Judgment and insight appears normal.   Condition at discharge: stable  The results of significant diagnostics from this hospitalization (including imaging, microbiology, ancillary and laboratory) are listed below for reference.   Imaging Studies: US Abdomen Limited RUQ (LIVER/GB)  Result Date: 05/08/2022 CLINICAL DATA:  Abnormal LFTs EXAM: ULTRASOUND ABDOMEN LIMITED RIGHT UPPER QUADRANT COMPARISON:  None Available. FINDINGS: Gallbladder: No gallstones or wall thickening visualized. No sonographic Murphy sign noted by sonographer. Common bile duct: Diameter: 3 mm.  No intrahepatic biliary dilation. Liver: Mild heterogeneity of the hepatic parenchyma with areas of increased echogenicity about the portal vein branches. Question periportal edema. Portal vein is patent on color Doppler imaging with normal direction of blood flow towards the liver. Other: None. IMPRESSION: No evidence of cholecystitis. Mild heterogeneity of the hepatic parenchyma with areas of increased echogenicity about the portal vein branches. Question periportal edema. Electronically Signed   By: Placido Sou M.D.   On: 05/08/2022 01:42   CT Angio Chest PE W and/or Wo Contrast  Result Date: 05/07/2022 CLINICAL DATA:  Chest pain short of breath EXAM: CT ANGIOGRAPHY CHEST WITH CONTRAST  TECHNIQUE: Multidetector CT imaging of the chest was performed using the standard protocol during bolus administration of intravenous contrast. Multiplanar CT image reconstructions and MIPs were obtained to evaluate the vascular anatomy. RADIATION DOSE REDUCTION: This exam was performed according to the departmental dose-optimization program which includes automated exposure control, adjustment of the mA and/or kV according to patient size and/or use of iterative reconstruction technique. CONTRAST:  12mL OMNIPAQUE IOHEXOL 350 MG/ML SOLN COMPARISON:  Chest x-ray 05/07/2022, CT 07/09/2013 FINDINGS: Cardiovascular: Satisfactory opacification of the pulmonary arteries to the segmental level. No evidence of pulmonary embolism. Nonaneurysmal aorta. No dissection is seen. Normal cardiac size. No pericardial effusion. Mediastinum/Nodes: Midline trachea. No thyroid mass. Esophagus within normal limits. Subcarinal mass measuring 3.4 cm AP by 5.2 cm craniocaudad. Lungs/Pleura: Small bilateral slightly dense pleural effusions versus pleural disease. Mildly enlarged axillary nodes, on the right measuring up to 12 mm and on the left measuring up to 11 mm in the subpectoral region. Fairly extensive upper lobe predominant bronchiectasis. Apical blebs or emphysema. Irregular foci and slightly bandlike densities throughout the upper lobes. Scattered  somewhat nodular foci of ground-glass density, for example left lower lobe 8 mm density on series 5, image 55. Suspicion of peribronchovascular nodularity in the right middle lobe and bilateral lower lobes. Upper Abdomen: Diffuse low-density liver.  No acute finding. Musculoskeletal: Scattered lytic/lucent lesions within the sternum and vertebral bodies. Review of the MIP images confirms the above findings. IMPRESSION: 1. Negative for acute pulmonary embolus or aortic dissection. 2. Extensive upper lobe predominant bronchiectasis with upper lobe blebs or emphysema. Fairly widespread  irregular and bandlike densities within the upper lobes possibly secondary to post infectious or post inflammatory scarring. There are however additional areas of bilateral irregular ground-glass density and nodularity with suspicion of peribronchovascular nodularity in the right middle and bilateral lower lobes; findings could be infectious, inflammatory, or malignant in etiology (?history of Kaposi's sarcoma). Mild hyperdense pleural effusions versus pleural metastatic disease. 3. Dominant subcarinal mass suspicious for metastatic disease versus lymphoproliferative disease/lymphoma. 4. Scattered lytic lesions in the sternum and spine concerning for metastatic disease Emphysema (ICD10-J43.9). Electronically Signed   By: Donavan Foil M.D.   On: 05/07/2022 21:55   DG Chest 2 View  Result Date: 05/07/2022 CLINICAL DATA:  Shortness of breath EXAM: CHEST - 2 VIEW COMPARISON:  X-ray 07/09/2013 FINDINGS: Tiny pleural effusions. Apical pleural thickening. Since the prior there is development of interstitial changes with diffuse lung distortion. Please correlate with any specific history. Additional workup is recommended when appropriate including for interstitial lung disease. Normal cardiopericardial silhouette. No consolidation. IMPRESSION: Tiny pleural effusions. There is interval development of distortion of the lung parenchyma with bulla with interstitial changes and possible bleb formation or bronchiectasis. Please correlate for any known history and dedicated workup with CT when clinically appropriate Electronically Signed   By: Jill Side M.D.   On: 05/07/2022 17:02    Microbiology: Results for orders placed or performed during the hospital encounter of 05/07/22  Group A Strep by PCR ([facility] Only)     Status: None   Collection Time: 05/07/22  8:32 PM   Specimen: Urine, Clean Catch; Sterile Swab  Result Value Ref Range Status   Group A Strep by PCR NOT DETECTED NOT DETECTED Final    Comment: Performed  at [facility], [address], [place], [place] [address]  Resp panel by RT-PCR (RSV, Flu A&B, Covid) Urine, Clean Catch     Status: None   Collection Time: 05/07/22  8:32 PM   Specimen: Urine, Clean Catch; Nasal Swab  Result Value Ref Range Status   SARS Coronavirus 2 by RT PCR NEGATIVE NEGATIVE Final    Comment: (NOTE) SARS-CoV-2 target nucleic acids are NOT DETECTED.  The SARS-CoV-2 RNA is generally detectable in upper respiratory specimens during the acute phase of infection. The lowest concentration of SARS-CoV-2 viral copies this assay can detect is 138 copies/mL. A negative result does not preclude SARS-Cov-2 infection and should not be used as the sole basis for treatment or other patient management decisions. A negative result may occur with  improper specimen collection/handling, submission of specimen other than nasopharyngeal swab, presence of viral mutation(s) within the areas targeted by this assay, and inadequate number of viral copies(<138 copies/mL). A negative result must be combined with clinical observations, patient history, and epidemiological information. The expected result is Negative.  Fact Sheet for Patients:  EntrepreneurPulse.com.au  Fact Sheet for Healthcare Providers:  IncredibleEmployment.be  This test is no t yet approved or cleared by the [place] FDA and  has been authorized for detection and/or diagnosis  of SARS-CoV-2 by FDA under an Emergency Use Authorization (EUA). This EUA will remain  in effect (meaning this test can be used) for the duration of the COVID-19 declaration under Section 564(b)(1) of the Act, 21 U.S.C.section 360bbb-3(b)(1), unless the authorization is terminated  or revoked sooner.       Influenza A by PCR NEGATIVE NEGATIVE Final   Influenza B by PCR NEGATIVE NEGATIVE Final    Comment: (NOTE) The Xpert Xpress SARS-CoV-2/FLU/RSV plus assay is intended as an aid in the  diagnosis of influenza from Nasopharyngeal swab specimens and should not be used as a sole basis for treatment. Nasal washings and aspirates are unacceptable for Xpert Xpress SARS-CoV-2/FLU/RSV testing.  Fact Sheet for Patients: EntrepreneurPulse.com.au  Fact Sheet for Healthcare Providers: IncredibleEmployment.be  This test is not yet approved or cleared by the [place] FDA and has been authorized for detection and/or diagnosis of SARS-CoV-2 by FDA under an Emergency Use Authorization (EUA). This EUA will remain in effect (meaning this test can be used) for the duration of the COVID-19 declaration under Section 564(b)(1) of the Act, 21 U.S.C. section 360bbb-3(b)(1), unless the authorization is terminated or revoked.     Resp Syncytial Virus by PCR NEGATIVE NEGATIVE Final    Comment: (NOTE) Fact Sheet for Patients: EntrepreneurPulse.com.au  Fact Sheet for Healthcare Providers: IncredibleEmployment.be  This test is not yet approved or cleared by the [place] FDA and has been authorized for detection and/or diagnosis of SARS-CoV-2 by FDA under an Emergency Use Authorization (EUA). This EUA will remain in effect (meaning this test can be used) for the duration of the COVID-19 declaration under Section 564(b)(1) of the Act, 21 U.S.C. section 360bbb-3(b)(1), unless the authorization is terminated or revoked.  Performed at [facility], [address] [place], [place] [address]   Culture, blood (x 2)     Status: None (Preliminary result)   Collection Time: 05/08/22  1:39 AM   Specimen: BLOOD  Result Value Ref Range Status   Specimen Description BLOOD BLOOD LEFT ARM  Final   Special Requests   Final    BOTTLES DRAWN AEROBIC AND ANAEROBIC Blood Culture adequate volume   Culture   Final    NO GROWTH < 12 HOURS Performed at [facility], [address], [place], [place]  [address]    Report Status PENDING  Incomplete  Culture, blood (x 2)     Status: None (Preliminary result)   Collection Time: 05/08/22  2:02 AM   Specimen: BLOOD  Result Value Ref Range Status   Specimen Description BLOOD BLOOD RIGHT ARM  Final   Special Requests   Final    BOTTLES DRAWN AEROBIC AND ANAEROBIC Blood Culture adequate volume   Culture   Final    NO GROWTH < 12 HOURS Performed at [facility], [address] [place], [place] [address]    Report Status PENDING  Incomplete    Labs: CBC: Recent Labs  Lab 05/07/22 1632 05/08/22 0139  WBC 9.4 8.6  NEUTROABS 6.5 6.0  HGB 13.0 10.5*  HCT 43.9 34.1*  MCV 88.3 88  PLT 148* 0000000*   Basic Metabolic Panel: Recent Labs  Lab 05/07/22 1632 05/08/22 0531 05/09/22 1112  NA 130* 128* 131*  K 4.0 3.5 3.2*  CL 95* 102 101  CO2 23 22 24   GLUCOSE 89 139* 123*  BUN 17 12 7   CREATININE 1.05 0.90 0.78  CALCIUM 8.1* 7.2* 7.5*   Liver Function Tests: Recent Labs  Lab  05/07/22 1632 05/08/22 0531  AST 121* 97*  ALT 32 25  ALKPHOS 216* 157*  BILITOT 1.5* 0.8  PROT 11.4* 9.1*  ALBUMIN 2.7* 2.0*   CBG: No results for input(s): "GLUCAP" in the last 168 hours.  Discharge time spent: greater than 30 minutes.  This record has been created using Systems analyst. Errors have been sought and corrected,but may not always be located. Such creation errors do not reflect on the standard of care.   Signed: Lorella Nimrod, MD [facility] 05/09/2022

## 2022-05-13 LAB — CULTURE, BLOOD (ROUTINE X 2)
Culture: NO GROWTH
Culture: NO GROWTH
Special Requests: ADEQUATE
Special Requests: ADEQUATE

## 2022-08-08 ENCOUNTER — Other Ambulatory Visit: Payer: Self-pay

## 2022-08-08 ENCOUNTER — Emergency Department: Payer: Medicaid - Out of State

## 2022-08-08 ENCOUNTER — Emergency Department
Admission: EM | Admit: 2022-08-08 | Discharge: 2022-08-08 | Disposition: A | Discharge status: Home / Self Care | Payer: Medicaid - Out of State | Attending: Student in an Organized Health Care Education/Training Program | Admitting: Student in an Organized Health Care Education/Training Program

## 2022-08-08 DIAGNOSIS — R079 Chest pain, unspecified: Secondary | ICD-10-CM | POA: Diagnosis not present

## 2022-08-08 DIAGNOSIS — R0602 Shortness of breath: Secondary | ICD-10-CM | POA: Insufficient documentation

## 2022-08-08 DIAGNOSIS — B20 Human immunodeficiency virus [HIV] disease: Secondary | ICD-10-CM | POA: Diagnosis not present

## 2022-08-08 HISTORY — DX: Malignant (primary) neoplasm, unspecified: C80.1

## 2022-08-08 LAB — CBC
HCT: 38.8 % — ABNORMAL LOW (ref 39.0–52.0)
Hemoglobin: 11.5 g/dL — ABNORMAL LOW (ref 13.0–17.0)
MCH: 26 pg (ref 26.0–34.0)
MCHC: 29.6 g/dL — ABNORMAL LOW (ref 30.0–36.0)
MCV: 87.6 fL (ref 80.0–100.0)
Platelets: 143 K/uL — ABNORMAL LOW (ref 150–400)
RBC: 4.43 MIL/uL (ref 4.22–5.81)
RDW: 20.2 % — ABNORMAL HIGH (ref 11.5–15.5)
WBC: 3.4 K/uL — ABNORMAL LOW (ref 4.0–10.5)
nRBC: 1.5 % — ABNORMAL HIGH (ref 0.0–0.2)

## 2022-08-08 LAB — BASIC METABOLIC PANEL
Anion gap: 14 (ref 5–15)
BUN: 9 mg/dL (ref 6–20)
CO2: 19 mmol/L — ABNORMAL LOW (ref 22–32)
Calcium: 7.8 mg/dL — ABNORMAL LOW (ref 8.9–10.3)
Chloride: 102 mmol/L (ref 98–111)
Creatinine, Ser: 0.78 mg/dL (ref 0.61–1.24)
GFR, Estimated: >60 mL/min (ref >60)
Glucose, Bld: 67 mg/dL — ABNORMAL LOW (ref 70–99)
Potassium: 3.9 mmol/L (ref 3.5–5.1)
Sodium: 135 mmol/L (ref 135–145)

## 2022-08-08 LAB — TROPONIN I (HIGH SENSITIVITY)
Troponin I (High Sensitivity): 6 ng/L (ref <18)
Troponin I (High Sensitivity): 8 ng/L (ref <18)

## 2022-08-08 MED ORDER — IOHEXOL 350 MG/ML SOLN
75.0000 mL | Freq: Once | INTRAVENOUS | Status: AC | PRN
  Administered 2022-08-08: 75 mL via INTRAVENOUS

## 2022-08-08 MED ORDER — OXYCODONE HCL 5 MG PO TABS
10.0000 mg | ORAL_TABLET | Freq: Once | ORAL | Status: AC
  Administered 2022-08-08: 10 mg via ORAL
  Filled 2022-08-08: qty 2

## 2022-08-08 NOTE — ED Provider Notes (Signed)
[facility] Provider Note    Event Date/Time   First MD Initiated Contact with Patient 08/08/22 1317     (approximate)   History   Chest Pain   HPI  Warren Farrell is a 32 y.o. male history of HIV on Biktarvy is currently being treated with chemotherapy for, [place], presents to the ER for evaluation of chest pain and shortness of breath.  States it does hurt when he takes deep breath.  He is not currently on any antibiotics.  Denies any cough.  Denies any abdominal pain.     Physical Exam   Triage Vital Signs: ED Triage Vitals  Enc Vitals Group     BP 08/08/22 1135 121/88     Pulse Rate 08/08/22 1135 88     Resp 08/08/22 1135 16     Temp 08/08/22 1135 97.7 F (36.5 C)     Temp Source 08/08/22 1135 Oral     SpO2 08/08/22 1135 97 %     Weight 08/08/22 1135 132 lb (59.9 kg)     Height 08/08/22 1135 6' (1.829 m)     Head Circumference --      Peak Flow --      Pain Score 08/08/22 1139 8     Pain Loc --      Pain Edu? --      Excl. in GC? --     Most recent vital signs: Vitals:   08/08/22 1330 08/08/22 1345  BP: (!) 128/104   Pulse: 71 90  Resp: (!) 21 19  Temp:    SpO2: 98% 100%     Constitutional: Alert  Eyes: Conjunctivae are normal.  Head: Atraumatic. Nose: No congestion/rhinnorhea. Mouth/Throat: Mucous membranes are moist.   Neck: Painless ROM.  Cardiovascular:   Good peripheral circulation. Respiratory: Normal respiratory effort.  No retractions.  Gastrointestinal: Soft and nontender.  Musculoskeletal:  no deformity Neurologic:  MAE spontaneously. No gross focal neurologic deficits are appreciated.  Skin:  Skin is warm, dry and intact. No rash noted. Psychiatric: Mood and affect are normal. Speech and behavior are normal.    ED Results / Procedures / Treatments   Labs (all labs ordered are listed, but only abnormal results are displayed) Labs Reviewed  BASIC METABOLIC PANEL - Abnormal; Notable for the following  components:      Result Value   CO2 19 (*)    Glucose, Bld 67 (*)    Calcium 7.8 (*)    All other components within normal limits  CBC - Abnormal; Notable for the following components:   WBC 3.4 (*)    Hemoglobin 11.5 (*)    HCT 38.8 (*)    MCHC 29.6 (*)    RDW 20.2 (*)    Platelets 143 (*)    nRBC 1.5 (*)    All other components within normal limits  HELPER T-LYMPH-CD4 ([facility] ONLY)  TROPONIN I (HIGH SENSITIVITY)  TROPONIN I (HIGH SENSITIVITY)     EKG  ED ECG REPORT I, Willy Eddy, the attending physician, personally viewed and interpreted this ECG.   Date: 08/08/2022  EKG Time: 11:33  Rate: 80  Rhythm: sinus  Axis: normal  Intervals: borderline prolonged qt  ST&T Change: no stemi    RADIOLOGY Please see ED Course for my review and interpretation.  I personally reviewed all radiographic images ordered to evaluate for the above acute complaints and reviewed radiology reports and findings.  These findings were personally discussed with the patient.  Please see medical record for radiology report.    PROCEDURES:  Critical Care performed: No  Procedures   MEDICATIONS ORDERED IN ED: Medications  oxyCODONE (Oxy IR/ROXICODONE) immediate release tablet 10 mg (has no administration in time range)  iohexol (OMNIPAQUE) 350 MG/ML injection 75 mL (75 mLs Intravenous Contrast Given 08/08/22 1411)     IMPRESSION / MDM / ASSESSMENT AND PLAN / ED COURSE  I reviewed the triage vital signs and the nursing notes.                              Differential diagnosis includes, but is not limited to, ACS, pericarditis, esophagitis, boerhaaves, pe, dissection, pna, bronchitis, costochondritis  Patient presenting to the ER for evaluation of symptoms as described above.  Based on symptoms, risk factors and considered above differential, this presenting complaint could reflect a potentially life-threatening illness therefore the patient will be placed on continuous pulse  oximetry and telemetry for monitoring.  Laboratory evaluation will be sent to evaluate for the above complaints.  CTA was ordered to evaluate for PE on my review and interpretation does not show any evidence of pulmonary embolism.  Has multiple chronic findings likely secondary to known HIV and Kaposi's sarcoma.  Cardiac enzyme negative no sign of infection or pneumonia.  Does appear stable and appropriate for outpatient follow-up.  Patient agreeable plan.       FINAL CLINICAL IMPRESSION(S) / ED DIAGNOSES   Final diagnoses:  Chest pain, unspecified type     Rx / DC Orders   ED Discharge Orders     None        Note:  This document was prepared using Dragon voice recognition software and may include unintentional dictation errors.    Willy Eddy, MD 08/08/22 [phone]

## 2022-08-08 NOTE — ED Triage Notes (Signed)
First RN note-  Upper chest pain and dizziness started at 0930 today. Pt is a cancer patient, missed 1 week of treatment in [place].  HR 90 70cbg 125/74 BP 324 ASA given by EMS

## 2022-08-08 NOTE — ED Triage Notes (Signed)
Pt states chest pain that started at 0930. Pt denies cardiac history, but states history of cancer. Pt states current pain is 8/10. Pt reports some shortness of breath as well.

## 2022-08-12 LAB — HELPER T-LYMPH-CD4 (ARMC ONLY)
% CD 4 Pos. Lymph.: 10 % — ABNORMAL LOW (ref 30.8–58.5)
Absolute CD 4 Helper: 80 /uL — ABNORMAL LOW (ref 359–1519)
Basophils Absolute: 0.1 10*3/uL (ref 0.0–0.2)
Basos: 1 %
EOS (ABSOLUTE): 0 10*3/uL (ref 0.0–0.4)
Eos: 0 %
Hematocrit: 41.1 % (ref 37.5–51.0)
Hemoglobin: 11.6 g/dL — ABNORMAL LOW (ref 13.0–17.7)
Immature Grans (Abs): 0.2 10*3/uL — ABNORMAL HIGH (ref 0.0–0.1)
Immature Granulocytes: 5 %
Lymphocytes Absolute: 0.8 10*3/uL (ref 0.7–3.1)
Lymphs: 18 %
MCH: 25.9 pg — ABNORMAL LOW (ref 26.6–33.0)
MCHC: 28.2 g/dL — ABNORMAL LOW (ref 31.5–35.7)
MCV: 92 fL (ref 79–97)
Monocytes Absolute: 0.3 10*3/uL (ref 0.1–0.9)
Monocytes: 7 %
NRBC: 1 % — ABNORMAL HIGH (ref 0–0)
Neutrophils Absolute: 3.2 10*3/uL (ref 1.4–7.0)
Neutrophils: 69 %
Platelets: 148 10*3/uL — ABNORMAL LOW (ref 150–450)
RBC: 4.48 x10E6/uL (ref 4.14–5.80)
RDW: 18.3 % — ABNORMAL HIGH (ref 11.6–15.4)
WBC: 4.6 10*3/uL (ref 3.4–10.8)

## 2023-05-23 NOTE — Consults (Signed)
 Medical Nutrition Therapy - brief note Consulted for Tube feeding recommendations.  Recommendations: - Continue folic acid  - Add multivitamin daily - Readvance diet as medically appropriate and add Mighty Shake TID (chocolate) - Weight pt at least twice weekly - Recheck vitamin D , add supplementation prn - Continue bowel regimen - intensify prn  - If respiratory status worsens and small bore enteral tube placed, use Nutren 1.5 @ goal of 46ml/hr   Initiate @ 23ml/hr, advancing 10ml q12hrs to goal  Closely follow K, Mg, PO4 and replete prn / prior to advancement  Provides: 2145kcals (36kcals/kg), 97gm protein (1.6gm/kg), free water  Additional fluid per provider discretion  Weight:   05/22/23 0015  Weight: 59.9 kg (132 lb 0.9 oz)   - initial measured wt (4/17): 59.9kg - lowest measured wt (4/17): 59.9kg  Estimated Nutrition Needs (using wt of 59.9kg): Kcals: 2100-2340kcals ( 35-39kcals/kg, MSJ x 1.3 - 1.5 ) Protein: 90-108gm (1.5-1.8gm/kg) Fluid: 73mL/kcal or per provider  Diet Rx: Diet NPO Now   Potential kcals daily from medications (based on highest rate over past ~24hrs +/- fixed dose medications)  - Dextrose -containing medications: none  - Lipid-containing medications: none  Recent Labs    05/23/23 0610 05/23/23 0030 05/22/23 1610 05/22/23 0915 05/22/23 0105  NA 131*  --  129* 129* 127*  126*  K 4.4  --  4.6 4.5 4.7  4.6  BUN 12  --  10 11 12  12   CREATININE 0.48*  --  0.48* 0.48* 0.62*  0.61*  WBC  --  6.2  --  9.6 10.4  MCV  --  91.9  --  88.3 87.6   Recent Labs    05/23/23 0610 05/22/23 1610 05/22/23 0915  MG 1.8 1.7 1.3*   Recent Labs    05/23/23 0610 05/23/23 0030 05/22/23 1610  PHOS 3.4 3.3 3.7   Recent Labs    05/23/23 1153 05/23/23 0610 05/22/23 1610 05/22/23 1609 05/22/23 1159  GLU 102* 104 120* 131* 92   No results for input(s): TRIG in the last 72 hours. Recent Labs    05/22/23 0105  AST 57*  57*  ALT 10  12   ALKPHOS 191*  190*  BILITOT 0.8  0.8  BILIDIR 0.6*   No results for input(s): CRP in the last 72 hours. Vitamin B-12  Date/Time Value Ref Range Status  05/22/2023 09:15 AM 1255 (H) 211 - 911 pg/mL Final    Comment:    Performing Laboratory [facility], [address],    Folate  Date/Time Value Ref Range Status  01/05/2017 09:17 PM 19.82 >4.78 ng/mL Final    Comment:    Lab studies performed by: [facility] located at [facility], [employer] [place], [place] [address]    Vit D, 25-Hydroxy  Date/Time Value Ref Range Status  10/17/2020 11:40 AM 11 (L) 30 - 100 ng/mL Final    Comment:    (Note) Vitamin D  Status         25-OH Vitamin D :  Deficiency:                    <20 ng/mL Insufficiency:             20 - 29 ng/mL Optimal:                 > or = 30 ng/mL  For 25-OH Vitamin D  testing on patients on  D2-supplementation and patients for whom quantitation  of D2  and D3 fractions is required, the QuestAssureD(TM) 25-OH VIT D, (D2,D3), LC/MS/MS is recommended: order  code 07111 (patients >32yrs). See Note 1  Note 1  For additional information, please refer to  http://education.[facility].com/faq/FAQ199  (This link is being provided for informational/ educational purposes only.) Performed by [facility], [address], [address], [place] [address]    No results found for: VITB1 No results found for: VITAMINB6 Copper  Date/Time Value Ref Range Status  07/20/2019 02:15 PM 116 70 - 175 mcg/dL Final    Comment:    (Note) This test was developed and its analytical performance characteristics have been determined by [facility], [place]. It has not been cleared or approved by the U.S. Food and Drug Administration. This assay has been validated pursuant to the CLIA regulations and is used for clinical purposes. Test Performed by Orlin Butts, [facility], [address], [place], [place] [address] Belvie LELON Rocks, M.D., Ph.D., Director of Laboratories [phone], CLIA [id number]    Recent Labs    05/22/23 0105  LACTICSERUM 1.7    Net IO Since Admission: 1,436.67 mL [05/23/23 1331] I/O reviewed Last BM (PTA)  Allergies  Allergen Reactions  . 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  Found to have bilateral infiltrates and GGOs likely multifocal PNA, sputum growing yeast, extrahepatic duct dilation with distended gallbladder.  Increasing oxygen needs, hypotensive requiring vasopressors; transferred to [facility] (4/17).  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    Please see RD / MSND Student note (4/17) for full assessment.  Small bore enteral tube ordered; per RN note (4/17), pt refusing at that time but agreed if his O2 requirement continue to rise then he would be agreeable to us  attempting to placing the dobhoff tube then.  Per Dr Maryanne note (4/18), plans to restart pediatric diet after bronch.  NST to continue to follow for changes to PN plan prn.   Abundio Seip, MS, RD, CNSC [phone]

## 2023-07-12 NOTE — Discharge Summary (Addendum)
 Physician Discharge Summary  Patient Name: Warren Farrell  Patient Age: 33 y.o.  Patient DOB: Jan 12, 1991  MRN: [id number]  Admit date: 05/22/2023  Discharge date:  07/12/2023   Admitting Physician:  Bethann Fairly, MD   Admission Diagnoses:  AIDS (acquired immune deficiency syndrome) (HCC) [B20]  Discharge Physician: Elsie JINNY Christian, DO   Discharge Diagnoses:  E faecalis and MRSA bacteremia History of AIDS on Biktarvy  Kaposi's sarcoma with concerns for new hepatic lesions Transaminitis PJP Acute hypoxic respiratory failure Anasarca with lower extremity swelling Intermittent tachycardia Generalized weakness Hypoglycemia Anemia of chronic disease Hyponatremia Hypocalcemia Hypomagnesia Severe protein calorie malnutrition 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 Fairly Warren Farrell is a 33 y.o. male with a PMHx pertinent for HIV progressing to AIDS 2/2 non-compliance, G6PD deficiency, diffuse Kaposi Sarcoma involving multi organs (follows with [facility]) and with chronic pain taking Norco, pulmonary hypertension, anxiety, depression. The patient presented to the [facility] ED as a transfer from OSH where they initially presented with acute on chronic worsening shortness of breath, productive cough, and weakness and malaise, and RUQ/epigastric pain.  OSH course CTA chest showed diffuse b/l infiltrates and GGOs likely multifocal PNA.  Sputum cultures grew non-speciated yeast, though the patient did not receive antifungal treatment.  CTA A/P showed intra and extra hepatic duct dilation with a distended gallbladder, though no GI or surgery consultation was had.  The patient was treated with cefepime, vancomycin, and on 4/15 Bactrim  was started.  He required increasing oxygen  requirements ultimately up to 40 L HFNC at 60% FiO2 from which he could not be weaned.  On 4/16, the patient became hypotensive and required levophed.  He was thus transferred here for higher-level of care and management. On arrival, patient is chronically ill appearing, on levophed and HFNC, neurologically intact.     Principal Problem:   AIDS (acquired immune deficiency syndrome) (HCC) Active Problems:   Bacteremia   Hospital course   E faecalis and MRSE bacteremia History of AIDS on Biktarvy  - Culture growing Mycobacterium gordonae/paragodonae - Completed 2 weeks of linezolid from 516-530 - Reviewed blood culture no growth to date - No fevers or worsening respiratory at this point   Kaposi's sarcoma with concerns for new hepatic lesion Transaminitis - CT showing intra and extrahepatic bile duct dilation, contracted gallbladder with enhancing wall with pericholecystic fluid - Trend liver function, suspect secondary to above - Hematology/oncology consulted and recommend following up with oncologist in [place]  PJP Concerns for pulmonary hypertension Acute hypoxic respiratory failure-resolved - Patient required mechanical ventilation after liver biopsy, continue supplemental oxygen with goal saturation greater than 90% - Continue as needed inhalers, pulmonary toilet Continue PJP prophylaxis MWF - CT on 6/2 negative for PE and in no acute changes - Sputum cultures noted, will reach back out to ID if there are any other further recommendations  Anasarca with lower extremity swelling - Echo with EF of 55 with mild TR, no vegetation - TEE with no evidence of vegetation - CT without contrast showed concern for pulmonary hypertension - Holding Lasix - Continue metoprolol , spironolactone - Lower extremity Dopplers negative for DVT - Patient declining to take spironolactone - Titrate oxygen as stated above (current saturating at 98%.  Should be able to go to room  air  Intermittent tachycardia - EKG consistent with sinus tachycardia, continue low-dose beta-blocker   Hypoglycemia - Patient tolerating diet, Zofran  as needed - Discontinue D5W -Monitor close  Anemia of chronic disease - Continue multivitamin, dietary supplements - S/p 1 unit of PRBC, no evidence of bleeding most likely secondary to chronic disease - Will get a hepatic panel, patient feeling unwell and concerned possible reaction?  Diarrhea-resolved - Stool cultures negative - Continue to hold bowel regimen  Hyponatremia secondary to hypervolemia Hypocalcemia Hypomagnesia - PPI was DC'd, continue H2 blocker - Continue p.o. 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  Cachectic Neck: Supple, No Lymphadenopathy, No Thyromegaly, No JVD  Chest /lung: Bilateral clear to auscultation, No rhonchi, No wheezing  Heart: Normal S1, S2. No murmur, gallop, click or rub.  Abdomen: soft, Non tender, Non distended, Active bowel sounds. No palpable organomegaly or masses. No rigidity, No guarding, direct, or rebound tenderness.  Neuro: AAO times 3 . No focal neurological deficit  Extremities: No deformity, no edema.  Psych: Mood Appropriate    Medication List     START taking these medications    * Biktarvy  50-200-25 mg Tab Generic drug:  bictegravir-emtricitabine -tenofovir  TAKE 1 TABLET BY MOUTH EVERY DAY   * bictegravir-emtricitabine -tenofovir  50-200-25 mg Tab Commonly known as: BIKTARVY  take 1 tablet every day by mouth .   busPIRone  5 mg Tab Commonly known as: BUSPAR  take 1 tablet in the morning and 1 tablet at noon and 1 tablet before bedtime by mouth.   Cholecalciferol  (Vitamin D3) 1,000 unit Tab take 2 tablets every day by mouth .   famotidine  10 mg Tab Commonly known as: PEPCID  take 10 mg in the morning and 10 mg before bedtime by mouth.   folic acid 1 mg Tab Commonly known as: FOLVITE take 1 tablet every day by mouth .   gabapentin  300 mg Cap Commonly known as: NEURONTIN  take 2 capsules in the morning and 2 capsules at noon and 2 capsules before bedtime by mouth.   methocarbamoL  500 mg Tab Commonly known as: ROBAXIN  take 1 tablet in the morning and 1 tablet at noon and 1 tablet in the evening and 1 tablet before bedtime by mouth.   metoprolol  tartrate 25 mg Tab Commonly known as: LOPRESSOR  take 25 mg every day by mouth .   ondansetron  HCL 4 mg Tab Commonly known as: ZOFRAN  take 1 tablet every 8 (eight) hours as needed by mouth  for Nausea.   oxyCODONE  5 mg Tab Commonly known as: ROXICODONE  take 1 tablet every 6 (six) hours as needed by mouth  for Pain.   polyethylene glycol powder Commonly known as: MIRALAX take 17 g every morning by mouth  Mix 1 Tablespoon (17 grams) in 8 ounces of water, juice, or milk..   sulfamethoxazole -trimethoprim  800-160 mg Tab Commonly known as: BACTRIM  DS  take 1 tablet every Monday, Wednesday, and Friday by mouth . Start taking on: July 14, 2023   therapeutic multivitamin 400 mcg Tab Commonly known as: THERAGRAN take 1 tablet every day by mouth .   thiamine  100 mg Tab Commonly known as: VITAMIN B1 take 1 tablet every day by mouth .   venlafaxine -XR 37.5 mg Cp24 Commonly known as: EFFEXOR -XR take 1 capsule with breakfast daily by mouth .      * * This  list has 2 medication(s) that are the same as other medications prescribed for you. Read the directions carefully, and ask your doctor or other care provider to review them with you.          CONTINUE taking these medications    Calcium take by mouth every day 1-2 chewables daily   magnesium  oxide 400 mg (241.3 mg magnesium ) Tab Commonly known as: MAG-OX take 1 Tab by mouth every 24 hours   pantoprazole  40 mg Tbec Commonly known as: PROTONIX  take 1 tablet in the morning and 1 tablet in the evening by mouth. take with meals. For stomach pain..   potassium chloride  SA 20 mEq Tbtq Commonly known as: K-DUR take 1 Tab by mouth every day       STOP taking these medications    ciclopirox 0.77 % Crea Commonly known as: LOPROX   ergocalciferol 1,250 mcg (50,000 unit) Cap Commonly known as: ERGOCALCIFEROL   OXYGEN-AIR DELIVERY SYSTEMS   tiZANidine 2 mg Tab Commonly known as: ZANAFLEX         Where to Get Your Medications     Information about where to get these medications is not yet available   Ask your nurse or doctor about these medications bictegravir-emtricitabine -tenofovir  50-200-25 mg Tab busPIRone  5 mg Tab Cholecalciferol  (Vitamin D3) 1,000 unit Tab folic acid 1 mg Tab gabapentin  300 mg Cap methocarbamoL  500 mg Tab ondansetron  HCL 4 mg Tab oxyCODONE  5 mg Tab polyethylene glycol powder sulfamethoxazole -trimethoprim  800-160 mg Tab therapeutic multivitamin 400 mcg Tab thiamine  100 mg Tab venlafaxine -XR 37.5 mg Cp24      Studies :   Lab Results  Component Value Date   NA 134 (L) 07/11/2023   NAISTAT 135 06/17/2023   K 4.1 07/11/2023   KISTAT 4.8 06/17/2023   CL 93 (L) 07/11/2023   CLISTAT 95 06/17/2023   CO2 35 (H) 07/11/2023   PCO2ARTISTAT 47 (H) 06/20/2023   PO2ARTISTAT 114 (H) 06/20/2023   BUN 8 07/11/2023   CREATININE 0.50 (L) 07/11/2023   CREATININE 0.60 06/17/2023   GLU 80 07/12/2023   GLUISTAT 104 (H) 06/17/2023   CA 8.3 (L)  07/11/2023   Lab Results  Component Value Date   WBC 7.8 07/11/2023   HGB 8.9 (L) 07/11/2023   HGBISTAT 8.2 (L) 06/17/2023   HCT 30.3 (L) 07/11/2023   HCTISTAT 24 (L) 06/17/2023   PLT 270 07/11/2023   MCV 97.7 07/11/2023   CTA IMPRESSION:   1. No PE.  2. Overall relatively unchanged appearance of the lungs from the most recent prior.  3. Multiple lucent skeletal lesions again noted.   CT abdomen and pelvis   1. Acute severe lung disease. Correlate for edema versus infection.  2. Innumerable liver lesions, new compared to prior 2019.  3. Moderate diffuse ascites.  4. Severe anasarca.  5. Diffuse lytic bone lesions may be associated with metastatic disease, lymphoma, etc.     CXR  Scattered bilateral airspace opacities with slightly improved aeration.  Trace pleural effusions.  Satisfactory  instrumentation.    Cultures: Histoplasma Urine Ag: neg Blastomyces Ag EIA: Neg Aspergillus Ag Serum: Neg BAL Aspergillus Ag: Neg   Pathology: 5/12:  Procedures/Addenda  Final Diagnosis  Liver, biopsy (N)   - Kaposi sarcoma (see comment)    TEE: 06/23/23  1. Overall left ventricular ejection fraction is estimated at 50 to 55%.  2. Low nornmal global left ventricular systolic function.  3. Normal right ventricular size and systolic function.  4. Mildly dilated pulmonary artery.  5. No evidence of any gross valvular vegetations   CT abdomen/ Pelvis 5/23: IMPRESSION:   1. Acute severe lung disease. Correlate for edema versus infection.  2. Innumerable liver lesions, new compared to prior 2019.  3. Moderate diffuse ascites.  4. Severe anasarca.  5. Diffuse lytic bone lesions may be associated with metastatic disease, lymphoma, etc.   Microbiology: Blood Cx 5/22: nGTD Blood Cx 5/19: NG Blood culture 5/16 4/4 no growth today Sputum Cx 5/13: 2+ WBCs, 1 + GNO; 1+ NF Blood Cx rt arm 19:48 5/12: Staph epidermidis 1/2 bottles Blood Cx rt arm 19:40 5/12: E. faecalis 1/2  bottles, staph epidermidis 1/2 bottles MRSE Liver lesion fungus Cx 4/28: No fungus isolated in 7 days Liver lesion AFB smear: No acid fast bacilli  seen  Liver lesion FNAC 4/28: NG   Antimicrobials during admission: Daptomycin 5/16- 5/19 Amp-sulbactam 5/14- 5/20 Ceftriaxone  5/17- 5/20 Linezolid 5/20- present Biktarvy  4/22- present Bactrim  treatment dose 4/18- 5/8, 5/13-5/16 Bactrim  prophylactic dose 5/8- 5/12, 5/19--      Pending Test Results   None   Future Appointment(s)             Future Appointments  Date Time Provider Department Center  08/06/2023  9:40 AM Bhide, Claiborne SQUIBB, MBBS [facility]  08/12/2023 10:15 AM Elwood Selinda SAUNDERS, MD [facility]          Patient Instructions  Discharge  Medications: As above  Discharge Activity: activity as tolerated   Discharge Diet and Speech Recommendations  Regular Diet (no restrictions)   Special Instructions - Medication Compliance - Smoking and Etoh cessation - Fall precautions - Please return to ED or call your physician if you have:  1. Fevers > 101.5 unresponsive to tylenol .  2. Abdominal pain and/or distention  3. Intractable nausea, vomiting or diarrhea  4. Inability to tolerate adequate oral intake of food  5. Neurologic changes, chest pain or shortness of breath  Discharge Wound Care:  None needed    A total of 43 minutes were spent face-to-face with the patient during this encounter and over half of that time was spent on counseling and coordination of care.   The relevant and important risks, side effects and benefits of their medications were reviwed with patient during hospitalization and it discharge. The patient was given the opportunity to discuss and ask questions about their medications, including target symptoms, potential risks, side effects/interactions and benefits of their medications,as well as their expected prognosis if non medication treatment options were chosen. The patient expresses  understanding all theses options and information and voluntarily consents to treatment  Patient agree with being Discharge from Hospital today    Signed: Elsie JINNY Christian, DO 07/12/2023 11:28 AM Parts of this note were dictated using voice recognition software and may contain some irregularities and grammatical errors, which are unintentional.   This note reflects my/our clinical thought processes and is intended primarily for the exchange of information between healthcare providers.  It is not written primarily to communicate  with the patient or family directly, which takes place in person at the bedside.  This note may contain sensitive information, including substance use, mental health, and consideration of sensitive, serious, or other do not miss diagnoses, which is further discussed at the bedside *Some images could not be shown.

## 2023-07-23 NOTE — Discharge Summary (Addendum)
 Attending Addendum  DISCHARGE SUMMARY    I have personally seen and evaluated this patient, discussed the case, reviewed pertinent imaging/lab tests/procedure notes, current functional status, and barriers to discharge and agree with advanced care provider's assessment and plan with additions/exceptions as noted below:  Warren [facility] was discharged with stable vital signs, was tolerating a diet without issues, pain was well controlled, with Last Bowel Movement: 07/22/23.  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  Rehabilitation Course Medically the patient improved in terms of endurance; but still continued supplemental oxygen with desaturations into the mid 80s while on room air and desaturation while on supplemental oxygen with activity.  Suppression antibiotic therapy with Bactrim  double strength will continue MWF.  At time of discharge there was a persistent transaminitis including an elevated total bilirubin potentially secondary to either a new hepatic lesion secondary to Kaposi's sarcoma or resulting from biopsy procedure.  Hospitalist initiated low-dose beta-blocker of 12.5 mg twice daily of metoprolol  for persistent tachycardia both at rest and with activity.  Marinol  was started to improve appetite with good success and patient consuming 50 to 100% of meals with a dose of Marinol  at 5 mg twice daily.  Sleep was also addressed with patient on a combination of Remeron and trazodone .  Multiple wounds were present on admission to IPR including a wound over the coccyx, friction injury to the right thigh and right arm; all wounds were healed, although patient remains at risk for future development of wounds if not carefully monitored.  Electrolyte abnormalities included both a persistent hyponatremia and hypomagnesemia for which the Hospitalist initiated a 1.5 L fluid restriction to address the hyponatremia and magnesium   oxide was started twice daily at 800 mg for the hypomagnesemia.  Additional lab abnormalities included a severe vitamin D deficiency with a vitamin D 25-hydroxy level of 12 for which the patient will be continued on supplementation after discharge, follow-up labs deferred to PCP with recommendation in 3 months for a repeat level.  DVT prophylaxis was discontinued at discharge as patient was able to walk up to 900 feet daily  Functionally he was able to walk up to 320 feet with rolling walker with Warren rest breaks marked by SpO2 levels dropping down to 87% while on supplemental oxygen with 45-second intervals resulting in an improvement to 94% SpO2.  He was able to navigate 5 clinic steps at a contact-guard assist level as well as being contact-guard assist for all transfers.  ADLs at time of discharge were at an independent level with exception of being modified independent for bathing, stand pivot transfers for toileting and for showering.  Speech Therapy addressed mild cognitive deficits with a slight improvement with MoCA testing resulting in a discharge score of a near normal 25/30 with persistent deficits in working memory and attention.  Patient was discharged to stay with his sister, family training was completed, with services of outpatient level PT/OT/SLP and recommendation for intermittent supervision after discharge  Diet level at discharge: regular diet consistency with thin liquids  Weightbearing precautions/prescribed orthoses at discharge no weightbearing precautions.  Patient was provided with step lock braces to address bilateral hamstring flexion contractures.  Recommended treatment plan; patient to continue wearing step lock braces after discharge.  He was discharged on supplemental oxygen with anticipation that he will be able to wean off as he continues to recover.  Follow-up with Oncology after discharge.  33 minutes spent with discharge planning.  This includes physical  examination, communication with the patient and/or family, coordination of care, documentation of the discharge summary and/or discharge instructions, and prescription preparation.   Warren Fess, MD 07/23/2023 2:59 PM  _____________________________________________________________________________________________________________    PM&R  Discharge Summary  Patient Name: Warren [facility]rell  Patient DOB: 1990/07/24  MRN: [id number]  Admit Date: 07/12/2023  Discharge Date: 07/23/2023  Admitting Physician: Warren Fess, MD  Admission Diagnoses: Critical illness myopathy  Discharge Physician: Warren Fess, MD  Discharge Diagnoses: Enterococcus faecalis + MRSE bacteremia, AIDS, Kaposi's sarcoma, cognitive communication disorder, acute hypoxic respiratory failure   Orders Placed This Encounter     PMR IMPAIRMENT GROUP CODE         Order Comments:             Impairment Group Code: 0003.8 - Neurologic Conditions: Neuromuscular Disorders; Etiology:  Critical Illness Myopathy             Onset of Impairment:  06/02/23  Disposition: Home with family   History of Present Illness:  Copied from PM&R Admission H&P on 07/12/2023   Warren [facility]rell  is a right handed 33 y.o. male with PMH significant for HIV/AIDS, Kaposi Sarcoma (previously treated with Docetaxel, then transitioned to Paclitaxel from 2018-2020 by Dr. Ethelyn, subsequently transitioned care to [facility]) HIV/AIDs (Last CD4 here 136 in 2022), non adherence to HART, previous PE in 2019 no longer on [employer] , hepatic steatosis, prior alcohol abuse, prior tobacco abuse, and G6PD deficiency admitted to inpatient rehab on 07/12/2023 for critical illness myopathy.  He initially presented to the ED 05/22/23 via transfer from outside [facility] with complaints of progressive shortness of breath, productive cough, generalized weakness, malaise and right upper quadrant pain.  Admitted for acute hypoxic respiratory failure secondary to PJP pneumonia  complicated by Enterococcus and MRSE bacteremia with negative TEE. Required intubation 05/23/2023.  Extubated 05/26/2023 following bronchoscopy.  Imaging demonstrated liver and splenic lesions.  IR performed liver biopsy 06/02/2023.  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 Heme-onc consulted with no recommendation for inpatient chemotherapy.  Patient developed hemoperitoneum following liver biopsy and is now status post hepatic embolization of right anterior hepatic artery on 06/17/2023.    ID consulted during admission. He completed 2 week course of linezolid 5/16-5/30 with recommendation to continue prophylactic bactrim .   Per the attending service, pt is medically stable for admission to acute inpatient rehab.    Patient evaluated by therapies with needs identified by Physical Therapy, Occupational Therapy, and Speech Language Pathology. Patient presents below previous baseline. Gaius currently requires assistance with ADLs, functional mobility, swallow function, and linguistics. IPR recommended for intense interdisciplinary rehabilitation prior to returning home.  Rehabilitation Course By Problem:  Rehabilitation for functional deficits secondary to critical illness myopathy with decreased activity tolerance, generalized weakness, impaired mobility, impaired sitting/standing static/dynamic balance, mild cognitive-communication deficits, pain, impaired IADLs, and impaired ADLs - Patient will transition outpatient therapies at discharge   Debility secondary to lengthy hospitalization / E. 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 -Completed 2 weeks of linezolid from 5/16-5/30 -Continue Bactrim  DS 800-160 mg 1 tab MWF for suppression -Blood cultures with no growth to date as of 06/20/2023 -Has been afebrile since 5/22 -TEE with no evidence of vegetation   Kaposi's sarcoma with concerns for new hepatic lesion  and disseminated osseous, GI, and respiratory lesions / Transaminitis -CT showing intra and extrahepatic bile duct dilation, contracted gallbladder with enhancing wall with pericholecystic  fluid -Patient would benefit from outpatient palliative care referral pending further discussion with heme-onc regarding treatment options  Last LFTs on 07/21/2023: -AST: 69 -ALT: 20 -Alkaline phosphatase: 420 -Total bilirubin: 1.3   Mild cognitive-communication deficits / Hypophonia -MoCA 23/30 on 6/8.  Repeat MoCA score was 25/30 on 6/17   Concerns for pulmonary hypertension / Acute hypoxic respiratory failure -O2 sat last 24 hours on 07/23/2023: 98-100% on 2 L oxygen via nasal cannula -Continue supplemental oxygen with goal saturation greater than 90% -CT on 6/2 negative for PE    Anasarca with lower extremity swelling - Resolved -Echo with EF of 55 with mild TR, no vegetation -CT without contrast showed concern for pulmonary hypertension - Lasix and spironolactone discontinued at discharge   Intermittent tachycardia -EKG 6/4 consistent with sinus tachycardia. 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  Consult placed with delivery of PRAFO boot and step lock braces on 6/11.  -At this point he would need further evaluation in the outpatient setting for possible phenol versus Botox injections +/- serial casting  Medications at discharge: -  Robaxin  500 mg 4 times daily as needed   Pain / Acute on chronic Pain Medications at discharge: -Robaxin  500 mg 4 times daily as needed -Oxycodone  10 mg every 6 hours as needed -Gabapentin  600 mg 3 times daily -Narcan as needed in the event of opioid overdose  -No medications will be refilled after patient discharge. He will need to follow up with PCP, PM&R outpatient clinic, ID, and palliative care for further management    Skin integrity / Severe chronic protein calorie malnutrition / Open wound over coccyx documented on 06/11/2023 - Healed / Traumatic friction injury for right thigh and right arm documented on 06/30/2023 - Healed - Wound care evaluated, with recommendations for preventative care -Oral intake last 24 hours on 07/23/2023: 75-100% of meals  Medications at discharge: -Marinol  5 mg twice daily before meals -Folic acid 1 mg daily -Multivitamin daily -Thiamine  100 mg daily   Risk for sleep disorder secondary to hospitalization Medications at discharge: -Trazodone  75 mg bedtime   Electrolyte surveillance / Hypervolemic hyponatremia/hypochloremia / Hypocalcemia / Hypomagnesemia Last labs on 07/21/2023: -Sodium: 133 -Potassium: 4.5  -Bicarb: 33 -Calcium: 8.6 -Magnesium : 1.5 -Albumin: 2.8   Anemia surveillance / Anemia of chronic disease status post transfusion of 1 unit of packed red blood cells on 6/4 Last labs on 07/18/2023: -WBC: 7.5 -Hemoglobin: 10.1 -MCV: 97.5 -Platelets: 284   Severe Vitamin D deficiency -Vitamin D 25-hydroxy level low at 12 on 05/27/2023. - Continue vitamin D3 2000 units daily at discharge -Follow-up with PCP for repeat labs and further management   Bowel / Nausea -Last Bowel Movement: 07/22/23. -MiraLAX, Colace, Peri-Colace, senna, Dulcolax, fiber supplements are all available over-the-counter as needed for constipation   Bladder -Voiding spontaneously at discharge   DVT prophylaxis - Discontinued at discharge.  Of note, patient is  ambulating >250 feet  Diet -Regular texture (2 g sodium) + thin liquids   Discharge Exam:  General:  Frail appearing male sitting upright in bed; NAD.  AAOx3, friendly + interactive. Able to talk in complete sentences without feeling SOB.        HEENT: [place]/AT. No hearing impairment. Nose w/out discharge. Oral mucosa moist.  Neck: Supple, no bruits, JVD, thyromegaly or adenopathy. Chest/Lungs:  Resp reg/unlabored; CTAB w/out wheezes/rales/rhonchi. Wearing 2L oxygen via nasal cannula Heart:  RRR, S1/S2, no murmur, rub or gallop; no peripheral edema. Pulses +2 radial and dorsalis pedis bilaterally. Abdomen:  Soft, ND, NT, normoactive. MSK:  MSK: Shoulder Abduction Elbow  Flexion Elbow  Extension Wrist  Extension Grip  RUE 4/5 4/5 4/5 4/5 4/5  LUE 4/5 4/5 4/5 4/5 4/5    Hip  Flexion Knee Extension Knee  Flexion Hip  Extension Ankle Dorsiflex Ankle Plantarflex  RLE 3+/5 3-/5 4/5 3/5 3-/5 3+/5  LLE 3+/5 3-/5 4/5 4-/5 3-/5 3+/5   NEURO:  Awake, alert, oriented to person, place, situation.  Appears to answer questions appropriately.  No speech deficits. SKIN: Warm and Dry with good turgor; Visible skin without rashes PSYCH: Mood appropriate; affect appropriate  Principal Problem:   Critical illness myopathy Active Problems:   AIDS (acquired immune deficiency syndrome) (HCC)   Severe protein-calorie malnutrition (HCC)   Alcohol use   Bulla of lung (HCC)   Chronic pain disorder  Allergies  Allergen Reactions  . 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 take before meals. Notes to patient: Helps appetite.   naloxone 4 mg/actuation Spry Commonly known as: Narcan Administer a single spray into one nostril, call 911. May repeat in 2 to 3 minutes with a new spray  in the other nostril.. Notes to patient: For accidental overdose.   traZODone  150 mg Tab Commonly known as: DESYREL  take 0.5 tablets every night by mouth . Notes to patient: Helps rest.       CHANGE how you take these medications    bictegravir-emtricitabine -tenofovir  50-200-25 mg Tab Commonly known as: BIKTARVY  take 1 tablet every day by mouth . What changed: Another medication with the same name was removed. Continue taking this medication, and follow the directions you see here. Notes to patient: HIV treatment.   methocarbamoL  500 mg Tab Commonly known as: ROBAXIN  take 1 tablet four times daily as needed by mouth  for Muscle spasms or Pain. What changed:  when to take this reasons to take this   metoprolol  tartrate 25 mg Tab Commonly known as: LOPRESSOR  take 0.5 tablets in the morning and 0.5 tablets before bedtime by mouth. What changed:  how much to take when to take this Notes to patient: Helps nerve pain.   oxyCODONE  10 mg Tab Commonly known as: ROXICODONE  take 1 tablet every 6 (six) hours as needed by mouth  for Pain. What changed:  medication strength how much to take       CONTINUE taking these medications    busPIRone  5 mg Tab Commonly known as: BUSPAR  take 1 tablet in the morning and 1 tablet at noon and 1 tablet before bedtime by mouth. Notes to patient: Helps treat anxiety.   Cholecalciferol  (Vitamin D3) 1,000 unit Tab take 2 tablets every day by mouth . Notes to patient: Over the counter supplement.  famotidine  10 mg Tab Commonly known as: PEPCID  take 1 tablet in the morning and 1 tablet before bedtime by mouth. Notes to patient: Helps belly acid.   folic acid 1 mg Tab Commonly known as: FOLVITE take 1 tablet every day by mouth . Notes to patient: Over the counter supplement.   gabapentin  300 mg Cap Commonly known as: NEURONTIN  take 2 capsules in the morning and 2 capsules at noon and 2 capsules before bedtime by mouth. Notes to  patient: Helps nerve pain.   sulfamethoxazole -trimethoprim  800-160 mg Tab Commonly known as: BACTRIM  DS take 1 tablet every Monday, Wednesday, and Friday by mouth . Notes to patient: This is an antibiotic.   therapeutic multivitamin 400 mcg Tab Commonly known as: THERAGRAN take 1 tablet every day by mouth . Notes to patient: Over the counter supplement.   thiamine  100 mg Tab Commonly known as: VITAMIN B1 take 1 tablet every day by mouth . Notes to patient: Over the counter supplement.   venlafaxine -XR 37.5 mg Cp24 Commonly known as: EFFEXOR -XR take 1 capsule with breakfast daily by mouth . Notes to patient: Helps mood.       STOP taking these medications    Calcium   magnesium  oxide 400 mg (241.3 mg magnesium ) Tab Commonly known as: MAG-OX   ondansetron  HCL 4 mg Tab Commonly known as: ZOFRAN    pantoprazole  40 mg Tbec Commonly known as: PROTONIX    polyethylene glycol powder Commonly known as: MIRALAX   potassium chloride  SA 20 mEq Tbtq Commonly known as: K-DUR         Where to Get Your Medications     These medications were [facility] to [facility] [place] [facility] [place], [place] - [address]  [address] [place] [address]    Phone: [phone]  bictegravir-emtricitabine -tenofovir  50-200-25 mg Tab busPIRone  5 mg Tab Cholecalciferol  (Vitamin D3) 1,000 unit Tab droNABinol  5 mg Cap famotidine  10 mg Tab folic acid 1 mg Tab gabapentin  300 mg Cap methocarbamoL  500 mg Tab metoprolol  tartrate 25 mg Tab naloxone 4 mg/actuation Spry oxyCODONE  10 mg Tab sulfamethoxazole -trimethoprim  800-160 mg Tab therapeutic multivitamin 400 mcg Tab thiamine  100 mg Tab traZODone  150 mg Tab venlafaxine -XR 37.5 mg Cp24    Significant Diagnostic Studies: Lab Results  Component Value Date   NA 133 (L) 07/21/2023   NAISTAT 135 06/17/2023   K 4.5 07/21/2023   KISTAT 4.8 06/17/2023   CL 95 (L) 07/21/2023   CLISTAT 95 06/17/2023   CO2 33 (H)  07/21/2023   PCO2ARTISTAT 47 (H) 06/20/2023   PO2ARTISTAT 114 (H) 06/20/2023   BUN 13 07/21/2023   CREATININE 0.62 (L) 07/21/2023   CREATININE 0.60 06/17/2023   GLU 108 (H) 07/23/2023   GLUISTAT 104 (H) 06/17/2023   CA 8.6 07/21/2023   Lab Results  Component Value Date   WBC 7.5 07/18/2023   HGB 10.1 (L) 07/18/2023   HGBISTAT 8.2 (L) 06/17/2023   HCT 34.8 (L) 07/18/2023   HCTISTAT 24 (L) 06/17/2023   PLT 284 07/18/2023   MCV 97.5 07/18/2023   Recent Labs    07/23/23 0759 07/22/23 2004 07/22/23 0731 07/21/23 0801 07/21/23 0754  GLU 108* 129* 73 81 68*   Lab Results  Component Value Date   VITD25 12 (L) 05/27/2023    Functional Outcomes PT Admit Status Discharge Status  Roll Left and Right Verbal cues;Incidental touching   Independent;Adaptive equipment      Sit to Lying Verbal cues;Incidental touching No physical assistance  Adaptive equipment;Independent  Lying to Sitting on Side of Bed Verbal cues;Incidental touching No physical assistance  Adaptive equipment;Independent      Sit to Stand Adaptive equipment;Verbal cues;Incidental touching No physical assistance  Adaptive equipment;Independent      Bed to Chair Transfer Adaptive equipment;Verbal cues 25% or less  Independent;Adaptive equipment      Wheel 50 Ft with 2 Turns Adaptive equipment;Incidental touching   Independent      Wheel 150 Feet Adaptive equipment;Incidental touching   Independent      Walk 10 Feet Adaptive equipment;Verbal cues;Physical assistance Total assistance  Adaptive equipment;Independent      Walk 50 Feet with Turns Incidental touching;Adaptive equipment No physical assistance Safety concerns Adaptive equipment;Independent      Walk 150 Feet   Safety concerns Adaptive equipment;Supervision      Walk 10 Feet on Uneven Surface Adaptive equipment;Verbal cues;Physical assistance Total assistance  Independent;Adaptive equipment      1 Step (Curb)  Adaptive equipment;Verbal cues 26%-50%  Independent;Adaptive equipment      4 Steps Adaptive equipment;Verbal cues 26%-50%  Adaptive equipment;Set-up / clean-up      12 Steps Adaptive equipment;Verbal cues 26%-50%      Safety concerns  Picking up Objects Adaptive equipment;Verbal cues 26%-50%  Independent;Adaptive equipment 25% or less     OT Admit Status Discharge Status  Eating Supervision   Independent      Oral Hygiene Supervision   Independent      Bathing Physical assistance 25% or less  Adaptive equipment;Independent      Upper Body Dressing Set-up / clean-up No physical assistance  Independent      Lower Body Dressing Physical assistance 25% or less  Independent      Putting On/Taking Off Footwear Physical assistance 26%-50%  Independent      Toileting Physical assistance 25% or less  Independent      Toilet Transfer Physical assistance 25% or less  Adaptive equipment;Independent       SLP Functional Outcomes/Goals: MoCA   Total Score Initial: 23 /30 (07/13/23 1302)  Current: 25 /30 (07/22/23 1429) Severity Rating Initial: Mild (18-25)  Current: Mild (18-25)  Follow up   Follow up Appointments:  What's Next What's Next        Schedule an appointment with PCP at [facility] as soon as possible for a visit message left with office you need a PCP appointment made to be seen in the next 10 days. please call office and schedule this if they do not reach out to you in the next 2 days. [address] [place] [address]  Phone [phone]         Follow up with [facility] [address] [place] [address] [phone]         Follow up with [facility] DONNETA LINDER [address] [place] [address] [phone]         Go to [facility] Monday Jul 28, 2023 Apointment made 07-28-23 at 1140 please call 24 hours ahead if need to cancel appointment. [address] [place] [address]  Phone [phone]  Jul  2 Follow Up [address] with Poorva P Bhide Wednesday Aug 06, 2023 9:40 AM [facility], A Department of [facility] [address] [place], [address] [place] [address] [phone]  Jul  8 FOLLOW UP/ACUTE with Selinda JONELLE Limb Tuesday Aug 12, 2023 10:15 AM [facility], Infectious Disease, A Department  of [facility] [address] [place], [address] [place] [address] [phone]  Jul  22 FOLLOW UP/ACUTE with Delon ONEIDA Bunker Tuesday Aug 26, 2023 1:15 PM Physical Medicine and Rehabilitation, A Department of [facility] [address] [place] [address] [phone]   Follow up Services: Outpatient PT, OT, SLP Discharge Equipment and Supplies: Rolling walker, shower chair, oxygen  Patient Instructions   Consuela GORMAN Husband, NP-C 07/23/2023 1:43 PM

## 2023-09-08 ENCOUNTER — Emergency Department: Payer: Self-pay

## 2023-09-08 ENCOUNTER — Encounter: Payer: Self-pay | Admitting: Emergency Medicine

## 2023-09-08 ENCOUNTER — Other Ambulatory Visit: Payer: Self-pay

## 2023-09-08 ENCOUNTER — Observation Stay
Admission: EM | Admit: 2023-09-08 | Discharge: 2023-09-09 | Disposition: A | Discharge status: Home / Self Care | Payer: Self-pay | Attending: Obstetrics and Gynecology | Admitting: Obstetrics and Gynecology

## 2023-09-08 DIAGNOSIS — J189 Pneumonia, unspecified organism: Principal | ICD-10-CM | POA: Diagnosis present

## 2023-09-08 DIAGNOSIS — B59 Pneumocystosis: Secondary | ICD-10-CM | POA: Insufficient documentation

## 2023-09-08 DIAGNOSIS — C469 Kaposi's sarcoma, unspecified: Principal | ICD-10-CM | POA: Diagnosis present

## 2023-09-08 DIAGNOSIS — E43 Unspecified severe protein-calorie malnutrition: Secondary | ICD-10-CM | POA: Diagnosis present

## 2023-09-08 DIAGNOSIS — B20 Human immunodeficiency virus [HIV] disease: Principal | ICD-10-CM

## 2023-09-08 DIAGNOSIS — E46 Unspecified protein-calorie malnutrition: Secondary | ICD-10-CM | POA: Diagnosis present

## 2023-09-08 DIAGNOSIS — R54 Age-related physical debility: Secondary | ICD-10-CM | POA: Insufficient documentation

## 2023-09-08 DIAGNOSIS — E441 Mild protein-calorie malnutrition: Secondary | ICD-10-CM | POA: Insufficient documentation

## 2023-09-08 DIAGNOSIS — G894 Chronic pain syndrome: Secondary | ICD-10-CM | POA: Diagnosis present

## 2023-09-08 DIAGNOSIS — Z8619 Personal history of other infectious and parasitic diseases: Secondary | ICD-10-CM

## 2023-09-08 DIAGNOSIS — F32A Depression, unspecified: Secondary | ICD-10-CM | POA: Insufficient documentation

## 2023-09-08 DIAGNOSIS — J9621 Acute and chronic respiratory failure with hypoxia: Secondary | ICD-10-CM

## 2023-09-08 LAB — CBC WITH DIFFERENTIAL/PLATELET
Abs Immature Granulocytes: 0.04 K/uL (ref 0.00–0.07)
Basophils Absolute: 0 K/uL (ref 0.0–0.1)
Basophils Relative: 1 %
Eosinophils Absolute: 0 K/uL (ref 0.0–0.5)
Eosinophils Relative: 0 %
HCT: 37.9 % — ABNORMAL LOW (ref 39.0–52.0)
Hemoglobin: 11.6 g/dL — ABNORMAL LOW (ref 13.0–17.0)
Immature Granulocytes: 1 %
Lymphocytes Relative: 45 %
Lymphs Abs: 2.2 K/uL (ref 0.7–4.0)
MCH: 28.9 pg (ref 26.0–34.0)
MCHC: 30.6 g/dL (ref 30.0–36.0)
MCV: 94.3 fL (ref 80.0–100.0)
Monocytes Absolute: 0.4 K/uL (ref 0.1–1.0)
Monocytes Relative: 9 %
Neutro Abs: 2 K/uL (ref 1.7–7.7)
Neutrophils Relative %: 44 %
Platelets: 129 K/uL — ABNORMAL LOW (ref 150–400)
RBC: 4.02 MIL/uL — ABNORMAL LOW (ref 4.22–5.81)
RDW: 15.9 % — ABNORMAL HIGH (ref 11.5–15.5)
WBC: 4.7 K/uL (ref 4.0–10.5)
nRBC: 1.3 % — ABNORMAL HIGH (ref 0.0–0.2)

## 2023-09-08 LAB — HEPATIC FUNCTION PANEL
ALT: 18 U/L (ref 0–44)
AST: 79 U/L — ABNORMAL HIGH (ref 15–41)
Albumin: 1.8 g/dL — ABNORMAL LOW (ref 3.5–5.0)
Alkaline Phosphatase: 332 U/L — ABNORMAL HIGH (ref 38–126)
Bilirubin, Direct: 0.5 mg/dL — ABNORMAL HIGH (ref 0.0–0.2)
Indirect Bilirubin: 1.4 mg/dL — ABNORMAL HIGH (ref 0.3–0.9)
Total Bilirubin: 1.9 mg/dL — ABNORMAL HIGH (ref 0.0–1.2)
Total Protein: 8.1 g/dL (ref 6.5–8.1)

## 2023-09-08 LAB — BASIC METABOLIC PANEL WITH GFR
Anion gap: 11 (ref 5–15)
BUN: 12 mg/dL (ref 6–20)
CO2: 31 mmol/L (ref 22–32)
Calcium: 7.2 mg/dL — ABNORMAL LOW (ref 8.9–10.3)
Chloride: 96 mmol/L — ABNORMAL LOW (ref 98–111)
Creatinine, Ser: 0.69 mg/dL (ref 0.61–1.24)
GFR, Estimated: >60 mL/min (ref >60)
Glucose, Bld: 88 mg/dL (ref 70–99)
Potassium: 3.5 mmol/L (ref 3.5–5.1)
Sodium: 138 mmol/L (ref 135–145)

## 2023-09-08 LAB — URINALYSIS, ROUTINE W REFLEX MICROSCOPIC
Bacteria, UA: NONE SEEN
Bilirubin Urine: NEGATIVE
Glucose, UA: NEGATIVE mg/dL
Hgb urine dipstick: NEGATIVE
Ketones, ur: NEGATIVE mg/dL
Leukocytes,Ua: NEGATIVE
Nitrite: NEGATIVE
Protein, ur: 100 mg/dL — AB
Specific Gravity, Urine: 1.029 (ref 1.005–1.030)
Squamous Epithelial / HPF: 0 /HPF (ref 0–5)
pH: 6 (ref 5.0–8.0)

## 2023-09-08 LAB — CBC
HCT: 34.9 % — ABNORMAL LOW (ref 39.0–52.0)
Hemoglobin: 10.7 g/dL — ABNORMAL LOW (ref 13.0–17.0)
MCH: 28.9 pg (ref 26.0–34.0)
MCHC: 30.7 g/dL (ref 30.0–36.0)
MCV: 94.3 fL (ref 80.0–100.0)
Platelets: 107 K/uL — ABNORMAL LOW (ref 150–400)
RBC: 3.7 MIL/uL — ABNORMAL LOW (ref 4.22–5.81)
RDW: 15.9 % — ABNORMAL HIGH (ref 11.5–15.5)
WBC: 6.3 K/uL (ref 4.0–10.5)
nRBC: 1.1 % — ABNORMAL HIGH (ref 0.0–0.2)

## 2023-09-08 LAB — PROCALCITONIN: Procalcitonin: <0.1 ng/mL

## 2023-09-08 LAB — LIPASE, BLOOD: Lipase: 19 U/L (ref 11–51)

## 2023-09-08 LAB — TROPONIN I (HIGH SENSITIVITY)
Troponin I (High Sensitivity): 9 ng/L (ref <18)
Troponin I (High Sensitivity): 10 ng/L (ref <18)

## 2023-09-08 LAB — LACTIC ACID, PLASMA: Lactic Acid, Venous: 1.7 mmol/L (ref 0.5–1.9)

## 2023-09-08 MED ORDER — ONDANSETRON HCL 4 MG/2ML IJ SOLN
4.0000 mg | Freq: Once | INTRAMUSCULAR | Status: AC
  Administered 2023-09-08: 4 mg via INTRAVENOUS
  Filled 2023-09-08: qty 2

## 2023-09-08 MED ORDER — DIPHENHYDRAMINE HCL 50 MG/ML IJ SOLN
25.0000 mg | Freq: Once | INTRAMUSCULAR | Status: AC
  Administered 2023-09-08: 25 mg via INTRAVENOUS
  Filled 2023-09-08: qty 1

## 2023-09-08 MED ORDER — IOHEXOL 350 MG/ML SOLN
75.0000 mL | Freq: Once | INTRAVENOUS | Status: AC | PRN
  Administered 2023-09-08: 75 mL via INTRAVENOUS

## 2023-09-08 MED ORDER — SODIUM CHLORIDE 0.9 % IV SOLN
500.0000 mg | Freq: Once | INTRAVENOUS | Status: AC
  Administered 2023-09-08: 500 mg via INTRAVENOUS
  Filled 2023-09-08: qty 5

## 2023-09-08 MED ORDER — MORPHINE SULFATE (PF) 4 MG/ML IV SOLN
4.0000 mg | Freq: Once | INTRAVENOUS | Status: AC
  Administered 2023-09-08: 4 mg via INTRAVENOUS
  Filled 2023-09-08: qty 1

## 2023-09-08 MED ORDER — LACTATED RINGERS IV BOLUS
1000.0000 mL | Freq: Once | INTRAVENOUS | Status: AC
  Administered 2023-09-08: 1000 mL via INTRAVENOUS

## 2023-09-08 MED ORDER — SODIUM CHLORIDE 0.9 % IV SOLN
1.0000 g | Freq: Once | INTRAVENOUS | Status: AC
  Administered 2023-09-08: 1 g via INTRAVENOUS
  Filled 2023-09-08: qty 10

## 2023-09-08 NOTE — Plan of Care (Signed)

## 2023-09-08 NOTE — ED Notes (Signed)
 Patient transported to CT

## 2023-09-08 NOTE — ED Provider Notes (Signed)
 Emergency department handoff note  Care of this patient was signed out to me at the end of the previous provider shift.  All pertinent patient information was conveyed and all questions were answered.  Patient pending CT angiography of the chest as well as CT of the abdomen and pelvis with IV contrast which shows signs of possible multifocal pneumonia.  Patient started on empiric antibiotics and in discussion with patient further about these findings, patient is continuing to require 3 L of oxygen via nasal cannula which is an increase from his baseline.  Therefore patient will require admission to the internal medicine service for further evaluation management.  Dispo: Admit to medicine   Dimitri Dsouza K, MD 09/08/23 2236

## 2023-09-08 NOTE — ED Notes (Signed)
 This RN called lab and spoke with Rosaline to ensure that pt's blood cultures were received by the lab. Michelle from lab informed this RN that cultures were in front of her, and she would receive them in the pt's chart shortly.

## 2023-09-08 NOTE — ED Notes (Signed)
 [place] at bedside

## 2023-09-08 NOTE — ED Triage Notes (Signed)
 Patient to ED via POV for abd pain with N/D x3 days. Currently has cancer- suppose to started back chemo soon. Wears 2L [place] at baseline. C/o CP and SOB as well.

## 2023-09-08 NOTE — ED Provider Notes (Signed)
 [facility] Provider Note    Event Date/Time   First MD Initiated Contact with Patient 09/08/23 1401     (approximate)   History   Abdominal Pain   HPI  Warren Farrell is a 33 y.o. male who presents to the ED for evaluation of Abdominal Pain   I review an outside hospital medical DC summary from 6/7, patient was admitted for nearly 2 months.  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 PJP.   Patient presents to the ED with his sister for evaluation of 3 days of pain throughout his chest, abdomen and left leg.  Nausea, melanotic stools and dysuria.  Reports compliance with all of his medications, I am on 16 different medicines every day.  Particularly Biktarvy  and Bactrim  reports compliance   Physical Exam   Triage Vital Signs: ED Triage Vitals  Encounter Vitals Group     BP 09/08/23 1335 (!) 134/100     Girls Systolic BP Percentile --      Girls Diastolic BP Percentile --      Boys Systolic BP Percentile --      Boys Diastolic BP Percentile --      Pulse Rate 09/08/23 1335 (!) 115     Resp 09/08/23 1335 18     Temp 09/08/23 1335 98.8 F (37.1 C)     Temp Source 09/08/23 1335 Oral     SpO2 09/08/23 1335 100 %     Weight 09/08/23 1336 113 lb (51.3 kg)     Height 09/08/23 1336 6' (1.829 m)     Head Circumference --      Peak Flow --      Pain Score 09/08/23 1335 10     Pain Loc --      Pain Education --      Exclude from Growth Chart --     Most recent vital signs: Vitals:   09/08/23 1335  BP: (!) 134/100  Pulse: (!) 115  Resp: 18  Temp: 98.8 F (37.1 C)  SpO2: 100%    General: Awake, no distress.  Frail and thin, appears unwell. CV:  Good peripheral perfusion.  Resp:  Normal effort.  Abd:  No distention.  Diffuse tenderness MSK:  No deformity noted.  Minimal swelling on the left leg when compared to the right.  Very poor muscle mass bilaterally. Neuro:  No focal  deficits appreciated. Other:     ED Results / Procedures / Treatments   Labs (all labs ordered are listed, but only abnormal results are displayed) Labs Reviewed  BASIC METABOLIC PANEL WITH GFR - Abnormal; Notable for the following components:      Result Value   Chloride 96 (*)    Calcium 7.2 (*)    All other components within normal limits  CBC - Abnormal; Notable for the following components:   RBC 3.70 (*)    Hemoglobin 10.7 (*)    HCT 34.9 (*)    RDW 15.9 (*)    Platelets 107 (*)    nRBC 1.1 (*)    All other components within normal limits  HEPATIC FUNCTION PANEL - Abnormal; Notable for the following components:   Albumin 1.8 (*)    AST 79 (*)    Alkaline Phosphatase 332 (*)    Total Bilirubin 1.9 (*)    Bilirubin, Direct 0.5 (*)    Indirect Bilirubin 1.4 (*)    All other components within  normal limits  CBC WITH DIFFERENTIAL/PLATELET - Abnormal; Notable for the following components:   RBC 4.02 (*)    Hemoglobin 11.6 (*)    HCT 37.9 (*)    RDW 15.9 (*)    Platelets 129 (*)    nRBC 1.3 (*)    All other components within normal limits  CULTURE, BLOOD (ROUTINE X 2)  CULTURE, BLOOD (ROUTINE X 2)  URINE CULTURE  LIPASE, BLOOD  LACTIC ACID, PLASMA  LACTIC ACID, PLASMA  URINALYSIS, ROUTINE W REFLEX MICROSCOPIC  PROCALCITONIN  TROPONIN I (HIGH SENSITIVITY)  TROPONIN I (HIGH SENSITIVITY)    EKG Sinus tachycardia rate of 112 bpm.  Incomplete right bundle.  No STEMI.  RADIOLOGY CXR interpreted by me without evidence of acute cardiopulmonary pathology. Chronic infiltrates persist  Official radiology report(s): DG Chest 2 View Result Date: 09/08/2023 EXAM: 2 VIEW(S) XRAY OF THE CHEST 09/08/2023 01:52:00 PM COMPARISON: 08/08/2022 CLINICAL HISTORY: cp. abd pain with N/D x3 days. Currently has cancer- suppose to started back chemo soon. Wears 2L [place] at baseline. C/o CP and SOB as well. FINDINGS: LUNGS AND PLEURA: Small bilateral pleural effusions. Upper lobe predominant  interstitial thickening and peribronchovascular pulmonary opacities with architectural distortion and hilar retraction are grossly similar. this limits evaluation for superimposed acute airspace disease. HEART AND MEDIASTINUM: No acute abnormality of the cardiac and mediastinal silhouettes. BONES AND SOFT TISSUES: No acute osseous abnormality. IMPRESSION: 1. Upper lobe predominant interstitial thickening and peribronchovascular pulmonary opacities with architectural distortion are grossly similar to 08/08/2022 and likely due to prior infection or inflammation. this limits evaluation for superimposed acute process. 2. Small bilateral pleural effusions. Electronically signed by: Rockey Kilts MD 09/08/2023 02:06 PM EDT RP Workstation: [id number]    PROCEDURES and INTERVENTIONS:  .Ultrasound ED Peripheral IV (Provider)  Date/Time: 09/08/2023 3:11 PM  Performed by: Claudene Rover, MD Authorized by: Claudene Rover, MD   Procedure details:    Indications: multiple failed IV attempts and poor IV access     Skin Prep: chlorhexidine gluconate     Location: right cephalic v.   Angiocath:  20 G   Bedside Ultrasound Guided: Yes     Images: not archived     Patient tolerated procedure without complications: Yes     Dressing applied: Yes   .1-3 Lead EKG Interpretation  Performed by: Claudene Rover, MD Authorized by: Claudene Rover, MD     Interpretation: abnormal     ECG rate:  115   ECG rate assessment: tachycardic     Rhythm: sinus tachycardia     Ectopy: none     Conduction: normal     Medications  morphine  (PF) 4 MG/ML injection 4 mg (has no administration in time range)  ondansetron  (ZOFRAN ) injection 4 mg (has no administration in time range)  lactated ringers  bolus 1,000 mL (has no administration in time range)  diphenhydrAMINE  (BENADRYL ) injection 25 mg (has no administration in time range)     IMPRESSION / MDM / ASSESSMENT AND PLAN / ED COURSE  I reviewed the triage vital signs and the  nursing notes.  Differential diagnosis includes, but is not limited to, sepsis, bacteremia, PE, SBO  {Patient presents with symptoms of an acute illness or injury that is potentially life-threatening.  Patient presents with fairly diffuse pain.  He reports compliance with his Biktarvy  and PJP prophylaxis.  He is tachycardic but hemodynamically stable without a fever.  Very frail and thin with diffuse tenderness and myalgias.  Initial blood work is generally reassuring with hemoglobin at  baseline for recent comparison in June, LFTs again similar, negative troponin and intact renal function.  Add on imaging, urinalysis due to his dysuria.  I suspect he will require admission but pending further workup and he is signed out to oncoming physician  Clinical Course as of 09/08/23 1513  Mon Sep 08, 2023  1449 USIV Right cephalic v [DS]  8544 Most recent CBC I can find is from 6/13 with a hemoglobin of 10.1. [DS]  1455 Most recent metabolic panel, 6/16 [DS]  1456 Creatinine 0.62, alkaline phosphatase 420, AST 69 with a normal ALT. [DS]    Clinical Course User Index [DS] Claudene Rover, MD     FINAL CLINICAL IMPRESSION(S) / ED DIAGNOSES   Final diagnoses:  None     Rx / DC Orders   ED Discharge Orders     None        Note:  This document was prepared using Dragon voice recognition software and may include unintentional dictation errors.   Claudene Rover, MD 09/08/23 [id number]

## 2023-09-09 DIAGNOSIS — J9621 Acute and chronic respiratory failure with hypoxia: Secondary | ICD-10-CM

## 2023-09-09 DIAGNOSIS — C4652 Kaposi's sarcoma of left lung: Secondary | ICD-10-CM

## 2023-09-09 DIAGNOSIS — C464 Kaposi's sarcoma of gastrointestinal sites: Secondary | ICD-10-CM

## 2023-09-09 DIAGNOSIS — D696 Thrombocytopenia, unspecified: Secondary | ICD-10-CM

## 2023-09-09 DIAGNOSIS — R627 Adult failure to thrive: Secondary | ICD-10-CM

## 2023-09-09 DIAGNOSIS — J984 Other disorders of lung: Secondary | ICD-10-CM

## 2023-09-09 DIAGNOSIS — B20 Human immunodeficiency virus [HIV] disease: Secondary | ICD-10-CM

## 2023-09-09 DIAGNOSIS — C460 Kaposi's sarcoma of skin: Secondary | ICD-10-CM

## 2023-09-09 DIAGNOSIS — C469 Kaposi's sarcoma, unspecified: Principal | ICD-10-CM

## 2023-09-09 DIAGNOSIS — C4651 Kaposi's sarcoma of right lung: Secondary | ICD-10-CM

## 2023-09-09 DIAGNOSIS — C467 Kaposi's sarcoma of other sites: Secondary | ICD-10-CM

## 2023-09-09 LAB — COMPREHENSIVE METABOLIC PANEL WITH GFR
ALT: 14 U/L (ref 0–44)
AST: 66 U/L — ABNORMAL HIGH (ref 15–41)
Albumin: 1.7 g/dL — ABNORMAL LOW (ref 3.5–5.0)
Alkaline Phosphatase: 271 U/L — ABNORMAL HIGH (ref 38–126)
Anion gap: 7 (ref 5–15)
BUN: 8 mg/dL (ref 6–20)
CO2: 28 mmol/L (ref 22–32)
Calcium: 7 mg/dL — ABNORMAL LOW (ref 8.9–10.3)
Chloride: 100 mmol/L (ref 98–111)
Creatinine, Ser: 0.57 mg/dL — ABNORMAL LOW (ref 0.61–1.24)
GFR, Estimated: >60 mL/min (ref >60)
Glucose, Bld: 80 mg/dL (ref 70–99)
Potassium: 3.2 mmol/L — ABNORMAL LOW (ref 3.5–5.1)
Sodium: 135 mmol/L (ref 135–145)
Total Bilirubin: 1.1 mg/dL (ref 0.0–1.2)
Total Protein: 6.9 g/dL (ref 6.5–8.1)

## 2023-09-09 LAB — URINE CULTURE: Culture: NO GROWTH

## 2023-09-09 LAB — CBC
HCT: 32.4 % — ABNORMAL LOW (ref 39.0–52.0)
Hemoglobin: 9.8 g/dL — ABNORMAL LOW (ref 13.0–17.0)
MCH: 29.5 pg (ref 26.0–34.0)
MCHC: 30.2 g/dL (ref 30.0–36.0)
MCV: 97.6 fL (ref 80.0–100.0)
Platelets: 93 K/uL — ABNORMAL LOW (ref 150–400)
RBC: 3.32 MIL/uL — ABNORMAL LOW (ref 4.22–5.81)
RDW: 16.1 % — ABNORMAL HIGH (ref 11.5–15.5)
WBC: 3.8 K/uL — ABNORMAL LOW (ref 4.0–10.5)
nRBC: 1.1 % — ABNORMAL HIGH (ref 0.0–0.2)

## 2023-09-09 LAB — RESP PANEL BY RT-PCR (RSV, FLU A&B, COVID)  RVPGX2
Influenza A by PCR: NEGATIVE
Influenza B by PCR: NEGATIVE
Resp Syncytial Virus by PCR: NEGATIVE
SARS Coronavirus 2 by RT PCR: NEGATIVE

## 2023-09-09 MED ORDER — ONDANSETRON HCL 4 MG/2ML IJ SOLN: 4.0000 mg | Freq: Four times a day (QID) | INTRAMUSCULAR | Status: DC | PRN

## 2023-09-09 MED ORDER — KETOROLAC TROMETHAMINE 15 MG/ML IJ SOLN: 15.0000 mg | Freq: Four times a day (QID) | INTRAMUSCULAR | Status: DC | PRN

## 2023-09-09 MED ORDER — PANTOPRAZOLE SODIUM 40 MG PO TBEC
40.0000 mg | DELAYED_RELEASE_TABLET | Freq: Two times a day (BID) | ORAL | Status: DC
  Administered 2023-09-09: 40 mg via ORAL
  Filled 2023-09-09: qty 1

## 2023-09-09 MED ORDER — FAMOTIDINE 20 MG PO TABS
10.0000 mg | ORAL_TABLET | Freq: Two times a day (BID) | ORAL | Status: DC
  Administered 2023-09-09: 10 mg via ORAL
  Filled 2023-09-09: qty 1

## 2023-09-09 MED ORDER — ACETAMINOPHEN 650 MG RE SUPP: 650.0000 mg | Freq: Four times a day (QID) | RECTAL | Status: DC | PRN

## 2023-09-09 MED ORDER — BICTEGRAVIR-EMTRICITAB-TENOFOV 50-200-25 MG PO TABS
1.0000 | ORAL_TABLET | Freq: Every day | ORAL | Status: DC
  Administered 2023-09-09: 1 via ORAL
  Filled 2023-09-09: qty 1

## 2023-09-09 MED ORDER — GABAPENTIN 300 MG PO CAPS
600.0000 mg | ORAL_CAPSULE | Freq: Three times a day (TID) | ORAL | Status: DC
  Administered 2023-09-09 (×2): 600 mg via ORAL
  Filled 2023-09-09 (×2): qty 2

## 2023-09-09 MED ORDER — TRAZODONE HCL 50 MG PO TABS
75.0000 mg | ORAL_TABLET | Freq: Every day | ORAL | Status: DC
  Administered 2023-09-09: 75 mg via ORAL
  Filled 2023-09-09: qty 2

## 2023-09-09 MED ORDER — SODIUM CHLORIDE 0.9 % IV SOLN: 500.0000 mg | INTRAVENOUS | Status: DC

## 2023-09-09 MED ORDER — OXYCODONE HCL 5 MG PO TABS
10.0000 mg | ORAL_TABLET | Freq: Four times a day (QID) | ORAL | Status: DC | PRN
  Administered 2023-09-09 (×2): 10 mg via ORAL
  Filled 2023-09-09 (×2): qty 2

## 2023-09-09 MED ORDER — VENLAFAXINE HCL ER 37.5 MG PO CP24
37.5000 mg | ORAL_CAPSULE | Freq: Every day | ORAL | Status: DC
  Administered 2023-09-09: 37.5 mg via ORAL
  Filled 2023-09-09: qty 1

## 2023-09-09 MED ORDER — SULFAMETHOXAZOLE-TRIMETHOPRIM 800-160 MG PO TABS: 1.0000 | ORAL_TABLET | ORAL | Status: DC

## 2023-09-09 MED ORDER — METOPROLOL TARTRATE 25 MG PO TABS
12.5000 mg | ORAL_TABLET | Freq: Two times a day (BID) | ORAL | Status: DC
  Administered 2023-09-09: 12.5 mg via ORAL
  Filled 2023-09-09: qty 1

## 2023-09-09 MED ORDER — SODIUM CHLORIDE 0.9 % IV SOLN
2.0000 g | INTRAVENOUS | Status: DC
  Administered 2023-09-09: 2 g via INTRAVENOUS
  Filled 2023-09-09: qty 20

## 2023-09-09 MED ORDER — VITAMIN D3 25 MCG (1000 UNIT) PO TABS
2000.0000 [IU] | ORAL_TABLET | Freq: Every day | ORAL | Status: DC
  Administered 2023-09-09: 2000 [IU] via ORAL
  Filled 2023-09-09 (×2): qty 2

## 2023-09-09 MED ORDER — ADULT MULTIVITAMIN W/MINERALS CH
1.0000 | ORAL_TABLET | Freq: Every day | ORAL | Status: DC
  Administered 2023-09-09: 1 via ORAL
  Filled 2023-09-09: qty 1

## 2023-09-09 MED ORDER — DRONABINOL 2.5 MG PO CAPS
5.0000 mg | ORAL_CAPSULE | Freq: Two times a day (BID) | ORAL | Status: DC
  Administered 2023-09-09: 5 mg via ORAL
  Filled 2023-09-09: qty 2

## 2023-09-09 MED ORDER — THIAMINE MONONITRATE 100 MG PO TABS
100.0000 mg | ORAL_TABLET | Freq: Every day | ORAL | Status: DC
Start: 2023-09-09 — End: 2023-09-09
  Administered 2023-09-09: 100 mg via ORAL
  Filled 2023-09-09: qty 1

## 2023-09-09 MED ORDER — METHOCARBAMOL 500 MG PO TABS: 500.0000 mg | ORAL_TABLET | Freq: Four times a day (QID) | ORAL | Status: DC | PRN

## 2023-09-09 MED ORDER — ALBUTEROL SULFATE (2.5 MG/3ML) 0.083% IN NEBU: 2.5000 mg | INHALATION_SOLUTION | RESPIRATORY_TRACT | Status: DC | PRN

## 2023-09-09 MED ORDER — ENOXAPARIN SODIUM 40 MG/0.4ML IJ SOSY
40.0000 mg | PREFILLED_SYRINGE | INTRAMUSCULAR | Status: DC
  Administered 2023-09-09: 40 mg via SUBCUTANEOUS
  Filled 2023-09-09: qty 0.4

## 2023-09-09 MED ORDER — ONDANSETRON HCL 4 MG PO TABS: 4.0000 mg | ORAL_TABLET | Freq: Four times a day (QID) | ORAL | Status: DC | PRN

## 2023-09-09 MED ORDER — BUSPIRONE HCL 10 MG PO TABS
5.0000 mg | ORAL_TABLET | Freq: Three times a day (TID) | ORAL | Status: DC
  Administered 2023-09-09 (×2): 5 mg via ORAL
  Filled 2023-09-09 (×2): qty 1

## 2023-09-09 MED ORDER — SERTRALINE HCL 50 MG PO TABS
25.0000 mg | ORAL_TABLET | Freq: Every day | ORAL | Status: DC
  Administered 2023-09-09: 25 mg via ORAL
  Filled 2023-09-09: qty 1

## 2023-09-09 MED ORDER — HYDROXYZINE HCL 25 MG PO TABS
25.0000 mg | ORAL_TABLET | Freq: Three times a day (TID) | ORAL | Status: DC | PRN
  Administered 2023-09-09: 25 mg via ORAL
  Filled 2023-09-09: qty 1

## 2023-09-09 MED ORDER — ACETAMINOPHEN 325 MG PO TABS: 650.0000 mg | ORAL_TABLET | Freq: Four times a day (QID) | ORAL | Status: DC | PRN

## 2023-09-09 NOTE — Plan of Care (Signed)
  Problem: Education: Goal: Knowledge of General Education information will improve Description: Including pain rating scale, medication(s)/side effects and non-pharmacologic comfort measures 09/09/2023 1532 by Lynnette Cena CROME, RN Outcome: Adequate for Discharge 09/09/2023 1531 by Lynnette Cena CROME, RN Outcome: Progressing   Problem: Health Behavior/Discharge Planning: Goal: Ability to manage health-related needs will improve Outcome: Adequate for Discharge   Problem: Clinical Measurements: Goal: Ability to maintain clinical measurements within normal limits will improve Outcome: Adequate for Discharge Goal: Will remain free from infection Outcome: Adequate for Discharge Goal: Diagnostic test results will improve Outcome: Adequate for Discharge Goal: Respiratory complications will improve Outcome: Adequate for Discharge Goal: Cardiovascular complication will be avoided Outcome: Adequate for Discharge   Problem: Activity: Goal: Risk for activity intolerance will decrease Outcome: Adequate for Discharge   Problem: Nutrition: Goal: Adequate nutrition will be maintained Outcome: Adequate for Discharge   Problem: Coping: Goal: Level of anxiety will decrease Outcome: Adequate for Discharge   Problem: Elimination: Goal: Will not experience complications related to bowel motility Outcome: Adequate for Discharge Goal: Will not experience complications related to urinary retention Outcome: Adequate for Discharge   Problem: Pain Managment: Goal: General experience of comfort will improve and/or be controlled Outcome: Adequate for Discharge   Problem: Safety: Goal: Ability to remain free from injury will improve Outcome: Adequate for Discharge   Problem: Skin Integrity: Goal: Risk for impaired skin integrity will decrease Outcome: Adequate for Discharge   Problem: Activity: Goal: Ability to tolerate increased activity will improve Outcome: Adequate for Discharge   Problem:  Clinical Measurements: Goal: Ability to maintain a body temperature in the normal range will improve Outcome: Adequate for Discharge   Problem: Clinical Measurements: Goal: Ability to maintain a body temperature in the normal range will improve Outcome: Adequate for Discharge   Problem: Respiratory: Goal: Ability to maintain adequate ventilation will improve Outcome: Adequate for Discharge Goal: Ability to maintain a clear airway will improve Outcome: Adequate for Discharge

## 2023-09-09 NOTE — Assessment & Plan Note (Addendum)
 Acute on chronic respiratory failure with hypoxia AIDS with history of PJP 07/2023 requiring intubation History of bacteremia 07/2023 (negative vegetations) CT consistent with pneumonia WBC and lactic acid normal.  Procalcitonin less than 0.1 Respiratory viral swab ordered and pending Will continue Rocephin  and azithromycin  for now Continue supplemental oxygen Follow blood cultures Continue PJP prophylaxis with Bactrim  Continue Biktarvy  Consider ID consult-has not yet followed up since his discharge in mid June

## 2023-09-09 NOTE — Consult Note (Signed)
 NAME: Warren Farrell  DOB: 15-Jul-1990  MRN: [id number]  Date/Time: 09/09/2023 11:26 AM  REQUESTING PROVIDER: Dr>Wouk Subjective:  REASON FOR CONSULT: AIDS ? Warren Farrell is a 33 y.o. with a history of HIV/AIDS diagnosed in 2018, disseminated KS ( bone/visceral) diagnosed in 2018 was on paclitaxel and biktarvy  2018-2020  but later not in care was hospitalized [facility] between 05/22/23-07/12/23 with SOB and treated for PJP, intubated, MRSE bacteremia , enterococcus bacteremia TEE was neg, IR did liver biopsy on 06/02/23 which was KS- but this procedure complicated by hemoperitoneum and he got PRBCs, and hepatic embolization of rt anterior hepatic artery on 06/17/23 was treated for the bacteremia with 2 weeks of linezolid discharged to rehab on 07/12/23 and later discharged mid June. 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  Resp 18  SpO2 100 %    Latest Reference Range & Units 09/08/23 14:07  WBC 4.0 - 10.5 K/uL 4.0 - 10.5 K/uL 4.7 6.3  Hemoglobin 13.0 - 17.0 g/dL 86.9 - 82.9 g/dL 88.3 (L) 89.2 (L)  HCT 39.0 - 52.0 % 39.0 - 52.0 % 37.9 (L) 34.9 (L)  Platelets 150 - 400 K/uL 150 - 400 K/uL 129 (L) 107 (L)  Creatinine 0.61 - 1.24 mg/dL 9.30  BC sent CXR upper lobe predominant interstitial thickening Started on  antibiotics for pneumonia Ct abdomen showed liver and spleen multiple hyodensities, ill defined retroperitoneal thickening Multiple lucencies in spine and pelvis    PMH AIDS KS PE PJP Hemoperitoneum Critical illness myopathy  PSH Hepatic artery embolization     Social History    Socioeconomic History   Marital status: Single    Spouse name: Not on file   Number of children: Not on file   Years of education: Not on file   Highest education level: Not on file  Occupational History   Not on file  Tobacco Use   Smoking status: Never   Smokeless tobacco: Never  Substance and Sexual Activity   Alcohol use: Not on file   Drug use: Not on file   Sexual activity: Not on file  Other Topics Concern   Not on file  Social History Narrative   Not on file   Social Drivers of Health   Financial Resource Strain: High Risk (07/14/2023)   Received from [facility]   Overall Financial Resource Strain (CARDIA)    Difficulty of Paying Living Expenses: Very hard  Food Insecurity: No Food Insecurity (07/14/2023)   Received from [facility]   Hunger Vital Sign    Within the past 12 months, you worried that your food would run out before you got the money to buy more.: Never true    Within the past 12 months, the food you bought just didn't last and you didn't have money to get more.: Never true  Transportation Needs: No Transportation Needs (07/23/2023)   Received from [facility] - Transportation    Lack of Transportation (Medical): No    Lack of Transportation (Non-Medical): No  Physical Activity: Inactive (07/14/2023)  Received from [facility]   Exercise Vital Sign    On average, how many days per week do you engage in moderate to strenuous exercise (like a brisk walk)?: 0 days    On average, how many minutes do you engage in exercise at this level?: 0 min  Stress: No Stress Concern Present (07/14/2023)   Received from [facility] - Occupational Stress Questionnaire    Feeling of Stress : Only a little  Social Connections: Socially Isolated (07/14/2023)   Received from [facility]   Social Connection and Isolation Panel    In a typical week, how many times do you talk on the phone with family,  friends, or neighbors?: Never    How often do you get together with friends or relatives?: Never    How often do you attend church or religious services?: Never    Do you belong to any clubs or organizations such as church groups, unions, fraternal or athletic groups, or school groups?: No    How often do you attend meetings of the clubs or organizations you belong to?: Never    Are you married, widowed, divorced, separated, never married, or living with a partner?: Never married  Intimate Partner Violence: Not At Risk (07/14/2023)   Received from [facility]   Humiliation, Afraid, Rape, and Kick questionnaire    Within the last year, have you been afraid of your partner or ex-partner?: No    Within the last year, have you been humiliated or emotionally abused in other ways by your partner or ex-partner?: No    Within the last year, have you been kicked, hit, slapped, or otherwise physically hurt by your partner or ex-partner?: No    Within the last year, have you been raped or forced to have any kind of sexual activity by your partner or ex-partner?: No    History reviewed. No pertinent family history. No Known Allergies I? Current Facility-Administered Medications  Medication Dose Route Frequency Provider Last Rate Last Admin   acetaminophen  (TYLENOL ) tablet 650 mg  650 mg Oral Q6H PRN Duncan, Hazel V, MD       Or   acetaminophen  (TYLENOL ) suppository 650 mg  650 mg Rectal Q6H PRN Cleatus Delayne GAILS, MD       albuterol  (PROVENTIL ) (2.5 MG/3ML) 0.083% nebulizer solution 2.5 mg  2.5 mg Nebulization Q2H PRN Duncan, Hazel V, MD       azithromycin  (ZITHROMAX ) 500 mg in sodium chloride  0.9 % 250 mL IVPB  500 mg Intravenous Q24H Cleatus Delayne GAILS, MD       bictegravir-emtricitabine -tenofovir  AF (BIKTARVY ) 50-200-25 MG per tablet 1 tablet  1 tablet Oral Daily Cleatus Delayne GAILS, MD   1 tablet at 09/09/23 9075   busPIRone  (BUSPAR ) tablet 5 mg  5 mg Oral TID Duncan, Hazel V, MD   5 mg at 09/09/23 9076    cefTRIAXone  (ROCEPHIN ) 2 g in sodium chloride  0.9 % 100 mL IVPB  2 g Intravenous Q24H Duncan, Hazel V, MD 200 mL/hr at 09/09/23 0926 2 g at 09/09/23 9073   cholecalciferol  (VITAMIN D3) tablet 2,000 Units  2,000 Units Oral Daily Cleatus Delayne GAILS, MD   2,000 Units at 09/09/23 9075   dronabinol  (MARINOL ) capsule 5 mg  5 mg Oral BID WC Duncan, Hazel V, MD   5 mg at 09/09/23 1001   enoxaparin  (LOVENOX ) injection 40 mg  40 mg Subcutaneous Q24H Duncan, Hazel V, MD   40 mg  at 09/09/23 9074   famotidine  (PEPCID ) tablet 10 mg  10 mg Oral BID Cleatus Delayne GAILS, MD   10 mg at 09/09/23 9076   gabapentin  (NEURONTIN ) capsule 600 mg  600 mg Oral TID Duncan, Hazel V, MD   600 mg at 09/09/23 9075   ketorolac  (TORADOL ) 15 MG/ML injection 15 mg  15 mg Intravenous Q6H PRN Duncan, Hazel V, MD       methocarbamol  (ROBAXIN ) tablet 500 mg  500 mg Oral QID PRN Duncan, Hazel V, MD       multivitamin with minerals tablet 1 tablet  1 tablet Oral Daily Cleatus Delayne GAILS, MD   1 tablet at 09/09/23 9075   ondansetron  (ZOFRAN ) tablet 4 mg  4 mg Oral Q6H PRN Duncan, Hazel V, MD       Or   ondansetron  (ZOFRAN ) injection 4 mg  4 mg Intravenous Q6H PRN Duncan, Hazel V, MD       oxyCODONE  (Oxy IR/ROXICODONE ) immediate release tablet 10 mg  10 mg Oral Q6H PRN Duncan, Hazel V, MD   10 mg at 09/09/23 9076   pantoprazole  (PROTONIX ) EC tablet 40 mg  40 mg Oral BID Duncan, Hazel V, MD   40 mg at 09/09/23 9075   [START ON 09/10/2023] sulfamethoxazole -trimethoprim  (BACTRIM  DS) 800-160 MG per tablet 1 tablet  1 tablet Oral Once per day on Monday Wednesday Friday Cleatus Delayne GAILS, MD       thiamine  (VITAMIN B1) tablet 100 mg  100 mg Oral Daily Duncan, Hazel V, MD   100 mg at 09/09/23 9075   traZODone  (DESYREL ) tablet 75 mg  75 mg Oral QHS Duncan, Hazel V, MD   75 mg at 09/09/23 0247   venlafaxine  XR (EFFEXOR -XR) 24 hr capsule 37.5 mg  37.5 mg Oral Q breakfast Cleatus Delayne GAILS, MD   37.5 mg at 09/09/23 9075     Abtx:  Anti-infectives (From admission,  onward)    Start     Dose/Rate Route Frequency Ordered Stop   09/10/23 1000  sulfamethoxazole -trimethoprim  (BACTRIM  DS) 800-160 MG per tablet 1 tablet        1 tablet Oral Once per day on Monday Wednesday Friday 09/09/23 0043     09/09/23 2200  azithromycin  (ZITHROMAX ) 500 mg in sodium chloride  0.9 % 250 mL IVPB        500 mg 250 mL/hr over 60 Minutes Intravenous Every 24 hours 09/09/23 0043 09/13/23 2159   09/09/23 1000  bictegravir-emtricitabine -tenofovir  AF (BIKTARVY ) 50-200-25 MG per tablet 1 tablet        1 tablet Oral Daily 09/09/23 0043     09/09/23 1000  cefTRIAXone  (ROCEPHIN ) 2 g in sodium chloride  0.9 % 100 mL IVPB        2 g 200 mL/hr over 30 Minutes Intravenous Every 24 hours 09/09/23 0043 09/14/23 0959   09/08/23 2115  cefTRIAXone  (ROCEPHIN ) 1 g in sodium chloride  0.9 % 100 mL IVPB        1 g 200 mL/hr over 30 Minutes Intravenous  Once 09/08/23 2106 09/08/23 2254   09/08/23 2115  azithromycin  (ZITHROMAX ) 500 mg in sodium chloride  0.9 % 250 mL IVPB        500 mg 250 mL/hr over 60 Minutes Intravenous  Once 09/08/23 2106 09/08/23 2312       REVIEW OF SYSTEMS:  Const: negative fever, negative chills, + weight loss Eyes: negative diplopia or visual changes, negative eye pain ENT: negative coryza, negative sore throat Resp: negative cough, hemoptysis, dyspnea  Cards: negative for chest pain, palpitations, lower extremity edema GU: negative for frequency, dysuria and hematuria GI: ++ abdominal pain, tightness, poor appetite Skin: skin nodules Heme: negative for easy bruising and gum/nose bleeding MS: weakness inability to stretch his legs Tightness behind knees Neurolo dizziness, vertigo, memory problems  Psych: , depression  Endocrine: negative for thyroid, diabetes Allergy/Immunology- negative for any medication or food allergies ?  Objective:  VITALS:  BP 120/81 (BP Location: Left Arm)   Pulse (!) 109   Temp 97.9 F (36.6 C)   Resp 16   Ht 6' (1.829 m)   Wt  51.3 kg   SpO2 100%   BMI 15.33 kg/m   PHYSICAL EXAM:  General: Alert, cooperative,emaciated , chronically ill Head: Normocephalic, without obvious abnormality, atraumatic. Eyes: Conjunctivae clear, anicteric sclerae. Pupils are equal ENT Nares normal. No drainage or sinus tenderness. Oral thrush Neck: , symmetrical, cervical  adenopathy, thyroid: non tender no carotid bruit and no JVD. Back: No CVA tenderness. Lungs: b/l air entry Crepts bases. Heart: Regular rate and rhythm, no murmur, rub or gallop. Abdomen: tight skin BS heard . Bowel sounds normal. No masses Extremities: thickened skin, extensive nodularity  Skin: as above Neurologic: Grossly non-focal Pertinent Labs Lab Results CBC    Component Value Date/Time   WBC 3.8 (L) 09/09/2023 0447   RBC 3.32 (L) 09/09/2023 0447   HGB 9.8 (L) 09/09/2023 0447   HGB 11.6 (L) 08/08/2022 1337   HCT 32.4 (L) 09/09/2023 0447   HCT 41.1 08/08/2022 1337   PLT 93 (L) 09/09/2023 0447   PLT 148 (L) 08/08/2022 1337   MCV 97.6 09/09/2023 0447   MCV 92 08/08/2022 1337   MCV 102 (H) 07/13/2013 0923   MCH 29.5 09/09/2023 0447   MCHC 30.2 09/09/2023 0447   RDW 16.1 (H) 09/09/2023 0447   RDW 18.3 (H) 08/08/2022 1337   RDW 13.3 07/13/2013 0923   LYMPHSABS 2.2 09/08/2023 1407   LYMPHSABS 0.8 08/08/2022 1337   MONOABS 0.4 09/08/2023 1407   EOSABS 0.0 09/08/2023 1407   EOSABS 0.0 08/08/2022 1337   BASOSABS 0.0 09/08/2023 1407   BASOSABS 0.1 08/08/2022 1337       Latest Ref Rng & Units 09/09/2023    4:47 AM 09/08/2023    2:07 PM 08/08/2022   11:41 AM  CMP  Glucose 70 - 99 mg/dL 80  88  67   BUN 6 - 20 mg/dL 8  12  9    Creatinine 0.61 - 1.24 mg/dL 9.42  9.30  9.21   Sodium 135 - 145 mmol/L 135  138  135   Potassium 3.5 - 5.1 mmol/L 3.2  3.5  3.9   Chloride 98 - 111 mmol/L 100  96  102   CO2 22 - 32 mmol/L 28  31  19    Calcium 8.9 - 10.3 mg/dL 7.0  7.2  7.8   Total Protein 6.5 - 8.1 g/dL 6.9  8.1    Total Bilirubin 0.0 - 1.2 mg/dL  1.1  1.9    Alkaline Phos 38 - 126 U/L 271  332    AST 15 - 41 U/L 66  79    ALT 0 - 44 U/L 14  18        Microbiology: Recent Results (from the past 240 hours)  Blood culture (routine x 2)     Status: None (Preliminary result)   Collection Time: 09/08/23  2:41 PM   Specimen: BLOOD  Result Value Ref Range Status  Specimen Description BLOOD BLOOD RIGHT FOREARM  Final   Special Requests   Final    BOTTLES DRAWN AEROBIC AND ANAEROBIC Blood Culture adequate volume   Culture   Final    NO GROWTH < 24 HOURS Performed at [facility], [address], [place], [place] [address]    Report Status PENDING  Incomplete  Blood culture (routine x 2)     Status: None (Preliminary result)   Collection Time: 09/08/23  2:46 PM   Specimen: BLOOD  Result Value Ref Range Status   Specimen Description BLOOD  RT BICEPT  Final   Special Requests   Final    BOTTLES DRAWN AEROBIC AND ANAEROBIC Blood Culture results may not be optimal due to an inadequate volume of blood received in culture bottles   Culture   Final    NO GROWTH < 24 HOURS Performed at [facility], [address] [place], [place] [address]    Report Status PENDING  Incomplete    Lines and Device Date on insertion # of days DC  Central line     Foley     ETT       IMAGING RESULTS:b/l GGO,  Mediastinal thickening  I have personally reviewed the films ? 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 Retroperitoneum, bones, liver and spleen and skin He needs to follow up at [facility] or specialized center for KS treatment Abdominal pain and tightness due to KS  B/l infiltrates, scarring lungs- from KS- I doubt there is other bacterial or viral pneumonia Recently treated PJP On bactrim  prophylaxis    Failure to  thrive- due to advanced AIDS prolonged hospitalization recently Nutrition consult  Anemia  Thrombocytopenia could be from AIDS/KS  Pain due to KS - palliative consult to address GOC and also pain management   ?   ________________________________________________ Discussed with patient, and his sister in detail, discussed with requesting provider

## 2023-09-09 NOTE — Progress Notes (Signed)
 [facility] [address] [facility] Liaison Note  Referral received from [facility], Corean Haddock, RN, for outpatient palliative care follow up at home.     Referral submitted.  Please call with any palliative care related questions or concerns.  Thank you for the opportunity to participate in this patient's care.  [facility] Liaison [phone]

## 2023-09-09 NOTE — TOC CM/SW Note (Signed)
 Transition of Care [facility]) - Inpatient Brief Assessment   Patient Details  Name: Warren Farrell MRN: [id number] Date of Birth: 02-03-1991  Transition of Care [facility]) CM/SW Contact:    Corean ONEIDA Haddock, RN Phone Number: 09/09/2023, 3:34 PM   Clinical Narrative:   Transition of Care [facility]) Screening Note   Patient Details  Name: Warren Farrell Date of Birth: 20-Apr-1990   Transition of Care [facility]) CM/SW Contact:    Corean ONEIDA Haddock, RN Phone Number: 09/09/2023, 3:34 PM    Transition of Care Department [facility]) has reviewed patient and no TOC needs have been identified at this time. If new patient transition needs arise, please place a TOC consult.  - [facility] door clinic information added to AVS - outpatient palliative referral made to [facility] with Civil engineer, contracting    Transition of Care Asessment: Insurance and Status: Selfpay Patient has primary care physician: No     Prior/Current Home Services: No current home services Social Drivers of Health Review: SDOH reviewed no interventions necessary Readmission risk has been reviewed: Yes Transition of care needs: no transition of care needs at this time

## 2023-09-09 NOTE — Discharge Instructions (Signed)

## 2023-09-09 NOTE — Plan of Care (Signed)
   Problem: Education: Goal: Knowledge of General Education information will improve Description Including pain rating scale, medication(s)/side effects and non-pharmacologic comfort measures Outcome: Progressing

## 2023-09-09 NOTE — Discharge Summary (Addendum)
 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 male with medical history significant of AIDS previously noncompliant with meds, currently on Biktarvy , metastatic Kaposi's sarcoma(liver, lymph nodes), frailty , chronic pain, previous PE no longer on AC, G6PD, , s/p prolonged hospitalization/rehab 5/16 - 07/23/23 for PJP pneumonia complicated by Enterococcus and MRSE bacteremia, required intubation, being admitted with pneumonia hypoxia on 3L, uncertain baseline requirement since recent hospitalization.  He presented with pain in his chest abdomen and leg.  He stated he is compliant with his Biktarvy  and JP prophylaxis.  Has not yet followed with ID since his discharge in mid June. In the ED, mildly tachycardic to 115 with otherwise normal vitals.  Labs notable for normal WBC and lactic acid, baseline anemia, abnormal LFTs above baseline with normal lipase. EKG with sinus tachycardia and right ventricular hypertrophy CTA chest negative for PE with findings suspicious for pneumonia-please see full report CT abdomen and pelvis suggesting widespread metastasis including subpleural liver retroperitoneum and others-please see full report Left lower extremity venous ultrasound negative for DVT Patient  given an LR bolus started on Rocephin  and ceftriaxone  and given morphine  for pain Admission requested.   Hospital Course:   Hx AIDS and disseminated kaposi sarcoma, recent admission elsewhere for pjp pneumonia (intubated) and bacteremia, presenting with generalized pains worse in legs and abdomen. W/u reveals known disseminated kaposi sarcoma. CT of chest also shows possible pneumonia and so was admitted for that. Patient does not have symptoms of pneumonia, no white count or fever. Seen by ID, this does not appear to be pneumonia. Symptoms all appear to be secondary to disseminated kaposi sarcoma. Can establish with ID here, who advises continuing current meds. For patient's kaposi sarcoma, he will f/u with his [place]  oncologist (our local oncologists do not treat this).   Procedures: none   Consultations: ID  Discharge Exam: Vitals:   09/09/23 0546 09/09/23 1503  BP: 120/81 99/73  Pulse: (!) 109 95  Resp: 16 18  Temp: 97.9 F (36.6 C) 97.9 F (36.6 C)  SpO2: 100% 100%    General: malnourished, chronically ill Cardiovascular: rrr Respiratory: ctab save for rales at bases  Discharge Instructions   Discharge Instructions     Amb Referral to Palliative Care   Complete by: As directed    Diet general   Complete by: As directed    Increase activity slowly   Complete by: As directed       Allergies as of 09/09/2023   No Known Allergies      Medication List     TAKE these medications    Biktarvy  50-200-25 MG Tabs tablet Generic drug: bictegravir-emtricitabine -tenofovir  AF Take 1 tablet by mouth daily.   busPIRone  5 MG tablet Commonly known as: BUSPAR  Take 5 mg by mouth 3 (  three) times daily. (Morning, noon, and bedtime)   calcium carbonate 750 MG chewable tablet Commonly known as: TUMS EX Chew 2 tablets by mouth daily.   dronabinol  5 MG capsule Commonly known as: MARINOL  Take 5 mg by mouth 2 (two) times daily with a meal.   famotidine  10 MG  tablet Commonly known as: PEPCID  Take 10 mg by mouth 2 (two) times daily.   folic acid 1 MG tablet Commonly known as: FOLVITE Take 1 mg by mouth daily.   gabapentin  300 MG capsule Commonly known as: NEURONTIN  Take 600 mg by mouth 3 (three) times daily. (Morning, noon, and bedtime)   ibuprofen 400 MG tablet Commonly known as: ADVIL Take 400 mg by mouth every 6 (six) hours as needed.   lidocaine  5 % Commonly known as: LIDODERM  Place 1 patch onto the skin daily. Remove & Discard patch within 12 hours or as directed by MD   magnesium  oxide 400 (240 Mg) MG tablet Commonly known as: MAG-OX Take 400 mg by mouth daily.   methocarbamol  500 MG tablet Commonly known as: ROBAXIN  Take 500 mg by mouth 4 (four) times daily as needed for muscle spasms.   metoprolol  tartrate 25 MG tablet Commonly known as: LOPRESSOR  Take 12.5 mg by mouth 2 (two) times daily.   naloxone 4 MG/0.1ML Liqd nasal spray kit Commonly known as: NARCAN Administer a single spray into one nostril, call 911. May repeat in 2 to 3 minutes with a new spray in the other nostril..   ondansetron  4 MG disintegrating tablet Commonly known as: ZOFRAN -ODT Take 4 mg by mouth every 4 (four) hours as needed for nausea or vomiting.   oxyCODONE  5 MG immediate release tablet Commonly known as: Oxy IR/ROXICODONE  Take 5 mg by mouth every 6 (six) hours as needed for severe pain.   Oxycodone  HCl 10 MG Tabs Take 10 mg by mouth every 6 (six) hours as needed (pain).   pantoprazole  40 MG tablet Commonly known as: PROTONIX  Take 40 mg by mouth 2 (two) times daily.   potassium chloride  SA 20 MEQ tablet Commonly known as: KLOR-CON  M Take 20 mEq by mouth once.   Quintabs Tabs Take 1 tablet by mouth.   sertraline  25 MG tablet Commonly known as: ZOLOFT  Take 1 tablet (25 mg total) by mouth daily. Hold until you see your doctor   sulfamethoxazole -trimethoprim  800-160 MG tablet Commonly known as: BACTRIM  DS Take 1 tablet by mouth 3  (three) times a week. (Monday, Wednesday, Friday)   thiamine  100 MG tablet Commonly known as: VITAMIN B1 Take 100 mg by mouth daily.   traZODone  150 MG tablet Commonly known as: DESYREL  Take 75 mg by mouth at bedtime.   venlafaxine  XR 37.5 MG 24 hr capsule Commonly known as: EFFEXOR -XR Take 37.5 mg by mouth daily with breakfast.   Vitamin D-1000 Max St 25 MCG (1000 UT) tablet Generic drug: Cholecalciferol  Take 2,000 Units by mouth daily.       No Known Allergies  Follow-up Information     Fayette Bodily, MD Follow up.   Specialty: Infectious Diseases Why: this is the infectious disease doctor who saw you in the hospital. you can call her clinic to schedule follow-up Contact information: [address] [place] [address] [phone]                  The results of significant diagnostics from this hospitalization (including imaging, microbiology, ancillary and laboratory) are listed below for reference.    Significant Diagnostic Studies:  CT ABDOMEN PELVIS W CONTRAST Result Date: 09/08/2023 CLINICAL DATA:  Pain dysuria EXAM: CT ABDOMEN AND PELVIS WITH CONTRAST TECHNIQUE: Multidetector CT imaging of the abdomen and pelvis was performed using the standard protocol following bolus administration of intravenous contrast. RADIATION DOSE REDUCTION: This exam was performed according to the departmental dose-optimization program which includes automated exposure control, adjustment of the mA and/or kV according to patient size and/or use of iterative reconstruction technique. CONTRAST:  75mL OMNIPAQUE  IOHEXOL  350 MG/ML SOLN COMPARISON:  CT 07/09/2013 FINDINGS: Lower chest: Lung bases demonstrate small bilateral complex pleural effusions versus pleural metastatic disease, slightly nodular appearance of the pleural surface. Mild cardiomegaly. Hepatobiliary: No calcified gallstone. Possible mild gallbladder wall thickening. No biliary dilatation. Water density lesion  in the anterior right liver measuring 2.5 cm probably a cyst but numerous additional subcentimeter hypodensities throughout the liver parenchyma. Pancreas: Unremarkable. No pancreatic ductal dilatation or surrounding inflammatory changes. Spleen: Numerous subcentimeter hypodensities within the spleen Adrenals/Urinary Tract: Adrenal glands are normal. Kidneys show no hydronephrosis. The bladder is diffusely thick walled Stomach/Bowel: The stomach is nonenlarged. There is no dilated small bowel. No acute bowel wall thickening Vascular/Lymphatic: Nonaneurysmal aorta. Ill-defined soft tissue thickening within the retroperitoneum surrounding the SMA and celiac arteries. Ill-defined hypodensity at the level of the renal arteries and veins as well. Scattered mildly prominent retroperitoneal lymph nodes. Reproductive: Negative prostate Other: No free air.  Small volume free fluid in the pelvis. Musculoskeletal: Edema and soft tissue stranding within the pelvis. Abnormal diffuse skin thickening and nodularity of the anterior proximal thighs with infiltrated appearance of the subcutaneous soft tissues. Multiple lucent lesions within the spine and pelvis consistent with osseous metastatic disease. IMPRESSION: 1. Small bilateral complex pleural effusions and or pleural metastatic disease. 2. Numerous subcentimeter hypodensities throughout the liver and spleen, indeterminate for metastatic disease versus disseminated infection. 3. Ill-defined soft tissue thickening within the retroperitoneum as described above, indeterminate for matted adenopathy/metastatic disease. 4. Diffuse bladder wall thickening, question cystitis. 5. Small volume free fluid in the pelvis. 6. Abnormal diffuse skin thickening and nodularity of the anterior proximal thighs and hips with infiltrated appearance of the soft tissues, correlate with direct inspection for cutaneous disease 7. Multiple lucent lesions within the spine and pelvis consistent with  osseous metastatic disease. Electronically Signed   By: Luke Bun M.D.   On: 09/08/2023 20:30   CT Angio Chest PE W and/or Wo Contrast Result Date: 09/08/2023 CLINICAL DATA:  Chest pain EXAM: CT ANGIOGRAPHY CHEST WITH CONTRAST TECHNIQUE: Multidetector CT imaging of the chest was performed using the standard protocol during bolus administration of intravenous contrast. Multiplanar CT image reconstructions and MIPs were obtained to evaluate the vascular anatomy. RADIATION DOSE REDUCTION: This exam was performed according to the departmental dose-optimization program which includes automated exposure control, adjustment of the mA and/or kV according to patient size and/or use of iterative reconstruction technique. CONTRAST:  75mL OMNIPAQUE  IOHEXOL  350 MG/ML SOLN COMPARISON:  CT chest 08/08/2022, 05/07/2022 FINDINGS: Cardiovascular: Satisfactory opacification of the pulmonary arteries to the segmental level. No evidence of pulmonary embolism. Nonaneurysmal aorta. Pulmonary trunk is dilated up to 3.6 cm. Borderline cardiomegaly. No significant pericardial effusion. Mediastinum/Nodes: Patent trachea. No thyroid mass. Possible matted supraclavicular nodes, series 5 image 18. No thyroid mass. Soft tissue thickening in the pre-vascular space, probable matted nodes. Abnormal soft tissue thickening throughout the mediastinum. Redemonstrated large subcarinal node or mass measuring 4.8 x 3.3 cm, previously 4.4 x 3.4 cm. Esophagus is unremarkable. Lungs/Pleura: Compared with 08/08/2022, increased slightly dense  pleural fluid and or possible pleural metastatic disease. Bronchiectasis, curvilinear bandlike densities and distortion in the upper lobes similar compared to prior and likely reflecting post inflammatory scarring. Age advanced emphysema. Increased loculated appearing air collections at the apices and left lateral chest, difficult to exclude loculated pneumothoraces. New ground-glass densities and mild consolidation  in the right greater than left lower lobes, possible acute pneumonia. Underlying peribronchovascular nodularity. Upper Abdomen: See separately dictated CT Musculoskeletal: No fracture. Multiple lucent lesions within the sternum and spine, slightly progressive compared to the most recent prior CT 1 and suspect for metastatic disease. Review of the MIP images confirms the above findings. IMPRESSION: 1. Negative for acute pulmonary embolus. 2. New ground-glass densities and mild consolidation in the right greater than left lower lobes, suspicious for acute pneumonia. Underlying peribronchovascular nodularity most evident in the lower lobes which may be infectious/inflammatory versus neoplastic in etiology 3. Compared with 08/08/2022, increased small bilateral slightly complex pleural effusions versus pleural metastatic disease. Increased loculated appearing air collections at the apices and left lateral chest, difficult to exclude loculated pneumothoraces. Age advanced emphysema 4. Abnormal soft tissue thickening throughout the mediastinum and supraclavicular regions, suspect for matted adenopathy. Redemonstrated large subcarinal node or mass measuring 4.8 x 3.3 cm, previously 4.4 x 3.4 cm. 5. Multiple lucent lesions within the sternum and spine, slightly progressive compared to the most recent prior CT and suspect for metastatic disease. 6. Age advanced emphysema. 7. Dilated pulmonary trunk up to 3.6 cm, can be seen with pulmonary arterial hypertension. Emphysema (ICD10-J43.9). Electronically Signed   By: Luke Bun M.D.   On: 09/08/2023 20:14   US  Venous Img Lower Unilateral Left Result Date: 09/08/2023 CLINICAL DATA:  Left lower extremity swelling and pain for 3 days EXAM: LEFT LOWER EXTREMITY VENOUS DOPPLER ULTRASOUND TECHNIQUE: Gray-scale sonography with graded compression, as well as color Doppler and duplex ultrasound were performed to evaluate the lower extremity deep venous systems from the level of the  common femoral vein and including the common femoral, femoral, profunda femoral, popliteal and calf veins including the posterior tibial, peroneal and gastrocnemius veins when visible. Spectral Doppler was utilized to evaluate flow at rest and with distal augmentation maneuvers in the common femoral, femoral and popliteal veins. COMPARISON:  None Available. FINDINGS: Contralateral Common Femoral Vein: Respiratory phasicity is normal and symmetric with the symptomatic side. No evidence of thrombus. Normal compressibility. Common Femoral Vein: No evidence of thrombus. Normal compressibility, respiratory phasicity and response to augmentation. Saphenofemoral Junction: No evidence of thrombus. Normal compressibility and flow on color Doppler imaging. Profunda Femoral Vein: No evidence of significant thrombus. Normal compressibility and flow on color Doppler imaging. Limited visualization. Femoral Vein: No evidence of thrombus. Normal compressibility, respiratory phasicity and response to augmentation. Popliteal Vein: No evidence of significant thrombus. Normal compressibility, respiratory phasicity and response to augmentation. Limited visualization. Calf Veins: No evidence of thrombus. Normal compressibility and flow on color Doppler imaging. Other Findings: Exam is limited because of the patient's inability to tolerate compression imaging and he was also not able to extend his leg during the exam. IMPRESSION: No significant left lower extremity DVT by ultrasound. Limited exam as above. Electronically Signed   By: CHRISTELLA.  Shick M.D.   On: 09/08/2023 16:30   DG Chest 2 View Result Date: 09/08/2023 EXAM: 2 VIEW(S) XRAY OF THE CHEST 09/08/2023 01:52:00 PM COMPARISON: 08/08/2022 CLINICAL HISTORY: cp. abd pain with N/D x3 days. Currently has cancer- suppose to started back chemo soon. Wears 2L [place] at baseline. C/o  CP and SOB as well. FINDINGS: LUNGS AND PLEURA: Small bilateral pleural effusions. Upper lobe predominant  interstitial thickening and peribronchovascular pulmonary opacities with architectural distortion and hilar retraction are grossly similar. this limits evaluation for superimposed acute airspace disease. HEART AND MEDIASTINUM: No acute abnormality of the cardiac and mediastinal silhouettes. BONES AND SOFT TISSUES: No acute osseous abnormality. IMPRESSION: 1. Upper lobe predominant interstitial thickening and peribronchovascular pulmonary opacities with architectural distortion are grossly similar to 08/08/2022 and likely due to prior infection or inflammation. this limits evaluation for superimposed acute process. 2. Small bilateral pleural effusions. Electronically signed by: Rockey Kilts MD 09/08/2023 02:06 PM EDT RP Workstation: [id number]    Microbiology: Recent Results (from the past 240 hours)  Blood culture (routine x 2)     Status: None (Preliminary result)   Collection Time: 09/08/23  2:41 PM   Specimen: BLOOD  Result Value Ref Range Status   Specimen Description BLOOD BLOOD RIGHT FOREARM  Final   Special Requests   Final    BOTTLES DRAWN AEROBIC AND ANAEROBIC Blood Culture adequate volume   Culture   Final    NO GROWTH < 24 HOURS Performed at [facility], [address], [place], [place] [address]    Report Status PENDING  Incomplete  Blood culture (routine x 2)     Status: None (Preliminary result)   Collection Time: 09/08/23  2:46 PM   Specimen: BLOOD  Result Value Ref Range Status   Specimen Description BLOOD  RT BICEPT  Final   Special Requests   Final    BOTTLES DRAWN AEROBIC AND ANAEROBIC Blood Culture results may not be optimal due to an inadequate volume of blood received in culture bottles   Culture   Final    NO GROWTH < 24 HOURS Performed at [facility], [address] [place], [place] [address]    Report Status PENDING  Incomplete  Resp panel by RT-PCR (RSV, Flu A&B, Covid) Anterior Nasal Swab     Status: None   Collection Time: 09/09/23 12:00  PM   Specimen: Anterior Nasal Swab  Result Value Ref Range Status   SARS Coronavirus 2 by RT PCR NEGATIVE NEGATIVE Final    Comment: (NOTE) SARS-CoV-2 target nucleic acids are NOT DETECTED.  The SARS-CoV-2 RNA is generally detectable in upper respiratory specimens during the acute phase of infection. The lowest concentration of SARS-CoV-2 viral copies this assay can detect is 138 copies/mL. A negative result does not preclude SARS-Cov-2 infection and should not be used as the sole basis for treatment or other patient management decisions. A negative result may occur with  improper specimen collection/handling, submission of specimen other than nasopharyngeal swab, presence of viral mutation(s) within the areas targeted by this assay, and inadequate number of viral copies(<138 copies/mL). A negative result must be combined with clinical observations, patient history, and epidemiological information. The expected result is Negative.  Fact Sheet for Patients:  BloggerCourse.com  Fact Sheet for Healthcare Providers:  SeriousBroker.it  This test is no t yet approved or cleared by the [place]  FDA and  has been authorized for detection and/or diagnosis of SARS-CoV-2 by FDA under an Emergency Use Authorization (EUA). This EUA will remain  in effect (meaning this test can be used) for the duration of the COVID-19 declaration under Section 564(b)(1) of the Act, 21 U.S.C.section 360bbb-3(b)(1), unless the authorization is terminated  or revoked sooner.       Influenza A by PCR NEGATIVE NEGATIVE Final   Influenza  B by PCR NEGATIVE NEGATIVE Final    Comment: (NOTE) The Xpert Xpress SARS-CoV-2/FLU/RSV plus assay is intended as an aid in the diagnosis of influenza from Nasopharyngeal swab specimens and should not be used as a sole basis for treatment. Nasal washings and aspirates are unacceptable for Xpert Xpress  SARS-CoV-2/FLU/RSV testing.  Fact Sheet for Patients: BloggerCourse.com  Fact Sheet for Healthcare Providers: SeriousBroker.it  This test is not yet approved or cleared by the [place]  FDA and has been authorized for detection and/or diagnosis of SARS-CoV-2 by FDA under an Emergency Use Authorization (EUA). This EUA will remain in effect (meaning this test can be used) for the duration of the COVID-19 declaration under Section 564(b)(1) of the Act, 21 U.S.C. section 360bbb-3(b)(1), unless the authorization is terminated or revoked.     Resp Syncytial Virus by PCR NEGATIVE NEGATIVE Final    Comment: (NOTE) Fact Sheet for Patients: BloggerCourse.com  Fact Sheet for Healthcare Providers: SeriousBroker.it  This test is not yet approved or cleared by the [place]  FDA and has been authorized for detection and/or diagnosis of SARS-CoV-2 by FDA under an Emergency Use Authorization (EUA). This EUA will remain in effect (meaning this test can be used) for the duration of the COVID-19 declaration under Section 564(b)(1) of the Act, 21 U.S.C. section 360bbb-3(b)(1), unless the authorization is terminated or revoked.  Performed at [facility], [address] [place], [place] [address]      Labs: Basic Metabolic Panel: Recent Labs  Lab 09/08/23 1407 09/09/23 0447  NA 138 135  K 3.5 3.2*  CL 96* 100  CO2 31 28  GLUCOSE 88 80  BUN 12 8  CREATININE 0.69 0.57*  CALCIUM 7.2* 7.0*   Liver Function Tests: Recent Labs  Lab 09/08/23 1407 09/09/23 0447  AST 79* 66*  ALT 18 14  ALKPHOS 332* 271*  BILITOT 1.9* 1.1  PROT 8.1 6.9  ALBUMIN 1.8* 1.7*   Recent Labs  Lab 09/08/23 1407  LIPASE 19   No results for input(s): AMMONIA in the last 168 hours. CBC: Recent Labs  Lab 09/08/23 1407 09/09/23 0447  WBC 4.7  6.3 3.8*  NEUTROABS 2.0  --   HGB  11.6*  10.7* 9.8*  HCT 37.9*  34.9* 32.4*  MCV 94.3  94.3 97.6  PLT 129*  107* 93*   Cardiac Enzymes: No results for input(s): CKTOTAL, CKMB, CKMBINDEX, TROPONINI in the last 168 hours. BNP: BNP (last 3 results) No results for input(s): BNP in the last 8760 hours.  ProBNP (last 3 results) No results for input(s): PROBNP in the last 8760 hours.  CBG: No results for input(s): GLUCAP in the last 168 hours.     Signed:  Devaughn KATHEE Ban MD.  [facility] 09/09/2023, 3:21 PM

## 2023-09-09 NOTE — Assessment & Plan Note (Addendum)
 Frailty and physical deconditioning Depression Continue chronic pain meds (Neurontin , Robaxin  and oxycodone ) Continue depression meds pending reconciliation PT and TOC consult

## 2023-09-09 NOTE — H&P (Signed)
 History and Physical    Patient: Warren Farrell FMW:[id number] DOB: 1990-05-11 DOA: 09/08/2023 DOS: the patient was seen and examined on 09/09/2023 PCP: Pcp, No  Patient coming from: Home  Chief Complaint:  Chief Complaint  Patient presents with   Abdominal Pain   HPI: Warren Farrell is a 33 y.o. male with medical history significant of AIDS previously noncompliant with meds, currently on Biktarvy , metastatic Kaposi's sarcoma(liver, lymph nodes), frailty , chronic pain, previous PE no longer on [facility], G6PD, , s/p prolonged hospitalization/rehab 5/16 - 07/23/23 for PJP pneumonia complicated by Enterococcus and MRSE bacteremia, required intubation, being admitted with pneumonia hypoxia on 3L, uncertain baseline requirement since recent hospitalization.  He presented with pain in his chest abdomen and leg.  He stated he is compliant with his Biktarvy  and JP prophylaxis.  Has not yet followed with ID since his discharge in mid June. In the ED, mildly tachycardic to 115 with otherwise normal vitals.  Labs notable for normal WBC and lactic acid, baseline anemia, abnormal LFTs above baseline with normal lipase. EKG with sinus tachycardia and right ventricular hypertrophy CTA chest negative for PE with findings suspicious for pneumonia-please see full report CT abdomen and pelvis suggesting widespread metastasis including subpleural liver retroperitoneum and others-please see full report Left lower extremity venous ultrasound negative for DVT Patient given an LR bolus started on Rocephin  and ceftriaxone  and given morphine  for pain Admission requested.    Past Medical History:  Diagnosis Date   Cancer [facility])     Social History:  reports that he has never smoked. He has never used smokeless tobacco. No history on file for alcohol use and drug use.  No Known Allergies  History reviewed. No pertinent family history.  Prior to Admission medications   Medication Sig Start Date End Date Taking? Authorizing  Provider  bictegravir-emtricitabine -tenofovir  AF (BIKTARVY ) 50-200-25 MG TABS tablet Take 1 tablet by mouth daily. 12/04/16  Yes [provider]  busPIRone  (BUSPAR ) 5 MG tablet Take 5 mg by mouth 3 (three) times daily. (Morning, noon, and bedtime) 07/22/23  Yes [provider]  Cholecalciferol  (VITAMIN D-1000 MAX ST) 25 MCG (1000 UT) tablet Take 2,000 Units by mouth daily. 07/23/23  Yes [provider]  dronabinol  (MARINOL ) 5 MG capsule Take 5 mg by mouth 2 (two) times daily with a meal. 07/22/23  Yes [provider]  famotidine  (PEPCID ) 10 MG tablet Take 10 mg by mouth 2 (two) times daily. 07/22/23  Yes [provider]  folic acid (FOLVITE) 1 MG tablet Take 1 mg by mouth daily. 12/04/16  Yes [provider]  gabapentin  (NEURONTIN ) 300 MG capsule Take 600 mg by mouth 3 (three) times daily. (Morning, noon, and bedtime) 07/22/23  Yes [provider]  methocarbamol  (ROBAXIN ) 500 MG tablet Take 500 mg by mouth 4 (four) times daily as needed for muscle spasms. 07/22/23  Yes [provider]  metoprolol  tartrate (LOPRESSOR ) 25 MG tablet Take 12.5 mg by mouth 2 (two) times daily. 07/22/23  Yes [provider]  Multiple Vitamin (QUINTABS) TABS Take 1 tablet by mouth. 07/22/23  Yes [provider]  naloxone (NARCAN) nasal spray 4 mg/0.1 mL Administer a single spray into one nostril, call 911. May repeat in 2 to 3 minutes with a new spray in the other nostril.. 07/22/23  Yes [provider]  Oxycodone  HCl 10 MG TABS Take 10 mg by mouth every 6 (six) hours as needed (pain). 07/22/23  Yes [provider]  sulfamethoxazole -trimethoprim  (BACTRIM   DS) 800-160 MG tablet Take 1 tablet by mouth 3 (three) times a week. (Monday, Wednesday, Friday) 01/03/17  Yes [provider]  thiamine  (VITAMIN B1) 100 MG tablet Take 100 mg by mouth daily. 07/22/23  Yes [provider]  traZODone  (DESYREL ) 150 MG tablet Take  75 mg by mouth at bedtime. 07/22/23  Yes [provider]  venlafaxine  XR (EFFEXOR -XR) 37.5 MG 24 hr capsule Take 37.5 mg by mouth daily with breakfast. 07/23/23  Yes [provider]  calcium carbonate (TUMS EX) 750 MG chewable tablet Chew 2 tablets by mouth daily.    [provider]  ibuprofen (ADVIL) 400 MG tablet Take 400 mg by mouth every 6 (six) hours as needed.    [provider]  lidocaine  (LIDODERM ) 5 % Place 1 patch onto the skin daily. Remove & Discard patch within 12 hours or as directed by MD    [provider]  magnesium  oxide (MAG-OX) 400 (240 Mg) MG tablet Take 400 mg by mouth daily.    [provider]  ondansetron  (ZOFRAN -ODT) 4 MG disintegrating tablet Take 4 mg by mouth every 4 (four) hours as needed for nausea or vomiting.    [provider]  oxyCODONE  (OXY IR/ROXICODONE ) 5 MG immediate release tablet Take 5 mg by mouth every 6 (six) hours as needed for severe pain.    [provider]  pantoprazole  (PROTONIX ) 40 MG tablet Take 40 mg by mouth 2 (two) times daily. 12/04/16   [provider]  potassium chloride  SA (KLOR-CON  M) 20 MEQ tablet Take 20 mEq by mouth once.    [provider]  sertraline  (ZOLOFT ) 25 MG tablet Take 1 tablet (25 mg total) by mouth daily. Hold until you see your doctor 05/09/22   Caleen Qualia, MD    Physical Exam: Vitals:   09/08/23 1822 09/08/23 2130 09/08/23 2242 09/08/23 2324  BP:    (!) 115/92  Pulse:  87  97  Resp:    18  Temp: 98.3 F (36.8 C)  98.6 F (37 C) 97.7 F (36.5 C)  TempSrc: Oral  Oral   SpO2:  100%  100%  Weight:      Height:       Physical Exam Vitals and nursing note reviewed.  Constitutional:      General: He is not in acute distress.    Comments: Thin and chronically ill-appearing male, not in distress  HENT:     Head: Normocephalic and atraumatic.  Cardiovascular:     Rate and Rhythm: Normal rate and regular rhythm.     Heart sounds:  Normal heart sounds.  Pulmonary:     Effort: Pulmonary effort is normal.     Breath sounds: Normal breath sounds.  Abdominal:     Palpations: Abdomen is soft.     Tenderness: There is no abdominal tenderness.  Neurological:     Mental Status: Mental status is at baseline.     Data Reviewed: Relevant notes from primary care and specialist visits, past discharge summaries as available in EHR, including Care Everywhere. Prior diagnostic testing as pertinent to current admission diagnoses Updated medications and problem lists for reconciliation ED course, including vitals, labs, imaging, treatment and response to treatment Triage notes, nursing and pharmacy notes and ED provider's notes Notable results as noted in HPI   Assessment and Plan: * CAP (community acquired pneumonia) Acute on chronic respiratory failure with hypoxia AIDS with history of PJP 07/2023 requiring intubation History of bacteremia 07/2023 (negative vegetations)  CT consistent with pneumonia WBC and lactic acid normal.  Procalcitonin less than 0.1 Respiratory viral swab ordered and pending Will continue Rocephin  and azithromycin  for now Continue supplemental oxygen Follow blood cultures Continue PJP prophylaxis with Bactrim  Continue Biktarvy  Consider ID consult-has not yet followed up since his discharge in mid June  Protein calorie malnutrition (HCC) Likely severe, with BMI 15 Dietary consult  Kaposi sarcoma (HCC) Liver and other metastases No acute disease suspected at this time  Chronic pain disorder Frailty and physical deconditioning Depression Continue chronic pain meds (Neurontin , Robaxin  and oxycodone ) Continue depression meds pending reconciliation PT and TOC consult     Advance Care Planning:   Code Status: Prior   Consults: none  Family Communication: none  Severity of Illness: The appropriate patient status for this patient is OBSERVATION. Observation status is judged to be  reasonable and necessary in order to provide the required intensity of service to ensure the patient's safety. The patient's presenting symptoms, physical exam findings, and initial radiographic and laboratory data in the context of their medical condition is felt to place them at decreased risk for further clinical deterioration. Furthermore, it is anticipated that the patient will be medically stable for discharge from the hospital within 2 midnights of admission.   Author: Delayne LULLA Solian, MD 09/09/2023 12:37 AM  For on call review [email or web address].

## 2023-09-09 NOTE — Progress Notes (Signed)
 PROGRESS NOTE    Warren Farrell  FMW:[id number] DOB: 1991/01/31 DOA: 09/08/2023 PCP: Pcp, No  Outpatient Specialists: oncology, ID    Brief Narrative:   From admission h and p  RUE TINNEL is a 33 y.o. male with medical history significant of AIDS previously noncompliant with meds, currently on Biktarvy , metastatic Kaposi's sarcoma(liver, lymph nodes), frailty , chronic pain, previous PE no longer on [facility], G6PD, , s/p prolonged hospitalization/rehab 5/16 - 07/23/23 for PJP pneumonia complicated by Enterococcus and MRSE bacteremia, required intubation, being admitted with pneumonia hypoxia on 3L, uncertain baseline requirement since recent hospitalization.  He presented with pain in his chest abdomen and leg.  He stated he is compliant with his Biktarvy  and JP prophylaxis.  Has not yet followed with ID since his discharge in mid June.   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 Recent extended admit with rehab stay in [place]  for PJP pneumonia, also e faecalis and mrsa bacteremia, was on a ventilator. Started on biktarvy  which patient says he has been taking. - continue biktarvy  - home bactrim  - will touch base w/ ID  # Pulmonary infiltrates # Emphysema Recent admit for pjp pneumonia as above. CTA of chest shows no PE. Does show ground glass densities and consolidation R>L lower lobes, also small bilateral slightly complex pleural effusions vs pleural metastatic disease. Also emphysema. WBC not elevated and procal is low, patient reports stable respiratory symptoms. Admitted for pneumonia - ID consult - will hold on additional abx for now  # Recent  history MRSA and E. Faecalis bacteremia Had negative tte/tee - monitor blood cultures  # Disseminated Kaposi sarcoma Suspect patient's various pains are secondary to this advancing disease. Discussed with Dr. Melanee, our oncology team does not primarily manage this disease, advises f/u with current oncology team in [place] , would need care at a tertiary care center if desires care in [place]. Dr. Melanee says nothing to do for this while inpatient other than treating pain.  # Severe malnutrition - RD consult - home marinol   # Chronic pain - home oxy, gabapentin , robaxin , effexor   # Palpitations - home metop  # GAD - home sertraline , buspar    DVT prophylaxis: lovenox  Code Status: full Family Communication: sister (who is emergency contact) updated at bedside  Level of care: Telemetry Medical Status is: Observation    Consultants:  ID  Procedures: none  Antimicrobials:  S/p ceftriaxone /azithromycin     Subjective: Reports stable pains in chest, abdomen, and legs  Objective: Vitals:   09/08/23 2130 09/08/23 2242 09/08/23 2324 09/09/23 0546  BP:   (!) 115/92 120/81  Pulse: 87  97 (!) 109  Resp:   18 16  Temp:  98.6 F (37 C) 97.7 F (36.5 C) 97.9 F (36.6 C)  TempSrc:  Oral    SpO2: 100%  100% 100%  Weight:      Height:        Intake/Output Summary (Last 24 hours) at 09/09/2023 1128 Last data filed at 09/09/2023 1100 Gross per 24 hour  Intake 360 ml  Output 250 ml  Net 110 ml   Filed Weights   09/08/23 1336  Weight: 51.3  kg    Examination:  General exam: Appears calm and comfortable. Cachectic, chronically ill appearing Respiratory system: scattered rales Cardiovascular system: S1 & S2 heard, RRR.   Gastrointestinal system: Abdomen is nondistended, soft and nontender.   Central nervous system: Alert and oriented. No focal neurological deficits. Extremities: Symmetric 5 x 5 power. Skin: No rashes, lesions or ulcers Psychiatry: Judgement and insight appear  normal. Mood & affect appropriate.     Data Reviewed: I have personally reviewed following labs and imaging studies  CBC: Recent Labs  Lab 09/08/23 1407 09/09/23 0447  WBC 4.7  6.3 3.8*  NEUTROABS 2.0  --   HGB 11.6*  10.7* 9.8*  HCT 37.9*  34.9* 32.4*  MCV 94.3  94.3 97.6  PLT 129*  107* 93*   Basic Metabolic Panel: Recent Labs  Lab 09/08/23 1407 09/09/23 0447  NA 138 135  K 3.5 3.2*  CL 96* 100  CO2 31 28  GLUCOSE 88 80  BUN 12 8  CREATININE 0.69 0.57*  CALCIUM 7.2* 7.0*   GFR: Estimated Creatinine Clearance: 94.4 mL/min (A) (by C-G formula based on SCr of 0.57 mg/dL (L)). Liver Function Tests: Recent Labs  Lab 09/08/23 1407 09/09/23 0447  AST 79* 66*  ALT 18 14  ALKPHOS 332* 271*  BILITOT 1.9* 1.1  PROT 8.1 6.9  ALBUMIN 1.8* 1.7*   Recent Labs  Lab 09/08/23 1407  LIPASE 19   No results for input(s): AMMONIA in the last 168 hours. Coagulation Profile: No results for input(s): INR, PROTIME in the last 168 hours. Cardiac Enzymes: No results for input(s): CKTOTAL, CKMB, CKMBINDEX, TROPONINI in the last 168 hours. BNP (last 3 results) No results for input(s): PROBNP in the last 8760 hours. HbA1C: No results for input(s): HGBA1C in the last 72 hours. CBG: No results for input(s): GLUCAP in the last 168 hours. Lipid Profile: No results for input(s): CHOL, HDL, LDLCALC, TRIG, CHOLHDL, LDLDIRECT in the last 72 hours. Thyroid Function Tests: No results for input(s): TSH, T4TOTAL, FREET4, T3FREE, THYROIDAB in the last 72 hours. Anemia Panel: No results for input(s): VITAMINB12, FOLATE, FERRITIN, TIBC, IRON, RETICCTPCT in the last 72 hours. Urine analysis:    Component Value Date/Time   COLORURINE AMBER (A) 09/08/2023 1528   APPEARANCEUR CLEAR (A) 09/08/2023 1528   APPEARANCEUR Clear 07/13/2013 0923   LABSPEC 1.029 09/08/2023 1528   LABSPEC 1.015 07/13/2013 0923   PHURINE 6.0 09/08/2023 1528    GLUCOSEU NEGATIVE 09/08/2023 1528   GLUCOSEU Negative 07/13/2013 0923   HGBUR NEGATIVE 09/08/2023 1528   BILIRUBINUR NEGATIVE 09/08/2023 1528   BILIRUBINUR Negative 07/13/2013 0923   KETONESUR NEGATIVE 09/08/2023 1528   PROTEINUR 100 (A) 09/08/2023 1528   NITRITE NEGATIVE 09/08/2023 1528   LEUKOCYTESUR NEGATIVE 09/08/2023 1528   LEUKOCYTESUR Negative 07/13/2013 0923   Sepsis Labs: @LABRCNTIP (procalcitonin:4,lacticidven:4)  ) Recent Results (from the past 240 hours)  Blood culture (routine x 2)     Status: None (Preliminary result)   Collection Time: 09/08/23  2:41 PM   Specimen: BLOOD  Result Value Ref Range Status   Specimen Description BLOOD BLOOD RIGHT FOREARM  Final   Special Requests   Final    BOTTLES DRAWN AEROBIC AND ANAEROBIC Blood Culture adequate volume   Culture   Final    NO GROWTH < 24 HOURS Performed at [facility], [address] [place], [place] [address]    Report Status PENDING  Incomplete  Blood culture (routine x 2)     Status: None (Preliminary  result)   Collection Time: 09/08/23  2:46 PM   Specimen: BLOOD  Result Value Ref Range Status   Specimen Description BLOOD  RT BICEPT  Final   Special Requests   Final    BOTTLES DRAWN AEROBIC AND ANAEROBIC Blood Culture results may not be optimal due to an inadequate volume of blood received in culture bottles   Culture   Final    NO GROWTH < 24 HOURS Performed at [facility], [address], [place], [place] [address]    Report Status PENDING  Incomplete         Radiology Studies: CT ABDOMEN PELVIS W CONTRAST Result Date: 09/08/2023 CLINICAL DATA:  Pain dysuria EXAM: CT ABDOMEN AND PELVIS WITH CONTRAST TECHNIQUE: Multidetector CT imaging of the abdomen and pelvis was performed using the standard protocol following bolus administration of intravenous contrast. RADIATION DOSE REDUCTION: This exam was performed according to the departmental dose-optimization program which includes  automated exposure control, adjustment of the mA and/or kV according to patient size and/or use of iterative reconstruction technique. CONTRAST:  75mL OMNIPAQUE  IOHEXOL  350 MG/ML SOLN COMPARISON:  CT 07/09/2013 FINDINGS: Lower chest: Lung bases demonstrate small bilateral complex pleural effusions versus pleural metastatic disease, slightly nodular appearance of the pleural surface. Mild cardiomegaly. Hepatobiliary: No calcified gallstone. Possible mild gallbladder wall thickening. No biliary dilatation. Water density lesion in the anterior right liver measuring 2.5 cm probably a cyst but numerous additional subcentimeter hypodensities throughout the liver parenchyma. Pancreas: Unremarkable. No pancreatic ductal dilatation or surrounding inflammatory changes. Spleen: Numerous subcentimeter hypodensities within the spleen Adrenals/Urinary Tract: Adrenal glands are normal. Kidneys show no hydronephrosis. The bladder is diffusely thick walled Stomach/Bowel: The stomach is nonenlarged. There is no dilated small bowel. No acute bowel wall thickening Vascular/Lymphatic: Nonaneurysmal aorta. Ill-defined soft tissue thickening within the retroperitoneum surrounding the SMA and celiac arteries. Ill-defined hypodensity at the level of the renal arteries and veins as well. Scattered mildly prominent retroperitoneal lymph nodes. Reproductive: Negative prostate Other: No free air.  Small volume free fluid in the pelvis. Musculoskeletal: Edema and soft tissue stranding within the pelvis. Abnormal diffuse skin thickening and nodularity of the anterior proximal thighs with infiltrated appearance of the subcutaneous soft tissues. Multiple lucent lesions within the spine and pelvis consistent with osseous metastatic disease. IMPRESSION: 1. Small bilateral complex pleural effusions and or pleural metastatic disease. 2. Numerous subcentimeter hypodensities throughout the liver and spleen, indeterminate for metastatic disease versus  disseminated infection. 3. Ill-defined soft tissue thickening within the retroperitoneum as described above, indeterminate for matted adenopathy/metastatic disease. 4. Diffuse bladder wall thickening, question cystitis. 5. Small volume free fluid in the pelvis. 6. Abnormal diffuse skin thickening and nodularity of the anterior proximal thighs and hips with infiltrated appearance of the soft tissues, correlate with direct inspection for cutaneous disease 7. Multiple lucent lesions within the spine and pelvis consistent with osseous metastatic disease. Electronically Signed   By: Luke Bun M.D.   On: 09/08/2023 20:30   CT Angio Chest PE W and/or Wo Contrast Result Date: 09/08/2023 CLINICAL DATA:  Chest pain EXAM: CT ANGIOGRAPHY CHEST WITH CONTRAST TECHNIQUE: Multidetector CT imaging of the chest was performed using the standard protocol during bolus administration of intravenous contrast. Multiplanar CT image reconstructions and MIPs were obtained to evaluate the vascular anatomy. RADIATION DOSE REDUCTION: This exam was performed according to the departmental dose-optimization program which includes automated exposure control, adjustment of the mA and/or kV according to patient size and/or use of iterative reconstruction technique. CONTRAST:  75mL OMNIPAQUE  IOHEXOL  350 MG/ML SOLN COMPARISON:  CT chest 08/08/2022, 05/07/2022 FINDINGS: Cardiovascular: Satisfactory opacification of the pulmonary arteries to the segmental level. No evidence of pulmonary embolism. Nonaneurysmal aorta. Pulmonary trunk is dilated up to 3.6 cm. Borderline cardiomegaly. No significant pericardial effusion. Mediastinum/Nodes: Patent trachea. No thyroid mass. Possible matted supraclavicular nodes, series 5 image 18. No thyroid mass. Soft tissue thickening in the pre-vascular space, probable matted nodes. Abnormal soft tissue thickening throughout the mediastinum. Redemonstrated large subcarinal node or mass measuring 4.8 x 3.3 cm,  previously 4.4 x 3.4 cm. Esophagus is unremarkable. Lungs/Pleura: Compared with 08/08/2022, increased slightly dense pleural fluid and or possible pleural metastatic disease. Bronchiectasis, curvilinear bandlike densities and distortion in the upper lobes similar compared to prior and likely reflecting post inflammatory scarring. Age advanced emphysema. Increased loculated appearing air collections at the apices and left lateral chest, difficult to exclude loculated pneumothoraces. New ground-glass densities and mild consolidation in the right greater than left lower lobes, possible acute pneumonia. Underlying peribronchovascular nodularity. Upper Abdomen: See separately dictated CT Musculoskeletal: No fracture. Multiple lucent lesions within the sternum and spine, slightly progressive compared to the most recent prior CT 1 and suspect for metastatic disease. Review of the MIP images confirms the above findings. IMPRESSION: 1. Negative for acute pulmonary embolus. 2. New ground-glass densities and mild consolidation in the right greater than left lower lobes, suspicious for acute pneumonia. Underlying peribronchovascular nodularity most evident in the lower lobes which may be infectious/inflammatory versus neoplastic in etiology 3. Compared with 08/08/2022, increased small bilateral slightly complex pleural effusions versus pleural metastatic disease. Increased loculated appearing air collections at the apices and left lateral chest, difficult to exclude loculated pneumothoraces. Age advanced emphysema 4. Abnormal soft tissue thickening throughout the mediastinum and supraclavicular regions, suspect for matted adenopathy. Redemonstrated large subcarinal node or mass measuring 4.8 x 3.3 cm, previously 4.4 x 3.4 cm. 5. Multiple lucent lesions within the sternum and spine, slightly progressive compared to the most recent prior CT and suspect for metastatic disease. 6. Age advanced emphysema. 7. Dilated pulmonary trunk  up to 3.6 cm, can be seen with pulmonary arterial hypertension. Emphysema (ICD10-J43.9). Electronically Signed   By: Luke Bun M.D.   On: 09/08/2023 20:14   US  Venous Img Lower Unilateral Left Result Date: 09/08/2023 CLINICAL DATA:  Left lower extremity swelling and pain for 3 days EXAM: LEFT LOWER EXTREMITY VENOUS DOPPLER ULTRASOUND TECHNIQUE: Gray-scale sonography with graded compression, as well as color Doppler and duplex ultrasound were performed to evaluate the lower extremity deep venous systems from the level of the common femoral vein and including the common femoral, femoral, profunda femoral, popliteal and calf veins including the posterior tibial, peroneal and gastrocnemius veins when visible. Spectral Doppler was utilized to evaluate flow at rest and with distal augmentation maneuvers in the common femoral, femoral and popliteal veins. COMPARISON:  None Available. FINDINGS: Contralateral Common Femoral Vein: Respiratory phasicity is normal and symmetric with the symptomatic side. No evidence of thrombus. Normal compressibility. Common Femoral Vein: No evidence of thrombus. Normal compressibility, respiratory phasicity and response to augmentation. Saphenofemoral Junction: No evidence of thrombus. Normal compressibility and flow on color Doppler imaging. Profunda Femoral Vein: No evidence of significant thrombus. Normal compressibility and flow on color Doppler imaging. Limited visualization. Femoral Vein: No evidence of thrombus. Normal compressibility, respiratory phasicity and response to augmentation. Popliteal Vein: No evidence of significant thrombus. Normal compressibility, respiratory phasicity and response to augmentation. Limited visualization. Calf Veins: No evidence of thrombus. Normal compressibility  and flow on color Doppler imaging. Other Findings: Exam is limited because of the patient's inability to tolerate compression imaging and he was also not able to extend his leg during the  exam. IMPRESSION: No significant left lower extremity DVT by ultrasound. Limited exam as above. Electronically Signed   By: CHRISTELLA.  Shick M.D.   On: 09/08/2023 16:30   DG Chest 2 View Result Date: 09/08/2023 EXAM: 2 VIEW(S) XRAY OF THE CHEST 09/08/2023 01:52:00 PM COMPARISON: 08/08/2022 CLINICAL HISTORY: cp. abd pain with N/D x3 days. Currently has cancer- suppose to started back chemo soon. Wears 2L [place] at baseline. C/o CP and SOB as well. FINDINGS: LUNGS AND PLEURA: Small bilateral pleural effusions. Upper lobe predominant interstitial thickening and peribronchovascular pulmonary opacities with architectural distortion and hilar retraction are grossly similar. this limits evaluation for superimposed acute airspace disease. HEART AND MEDIASTINUM: No acute abnormality of the cardiac and mediastinal silhouettes. BONES AND SOFT TISSUES: No acute osseous abnormality. IMPRESSION: 1. Upper lobe predominant interstitial thickening and peribronchovascular pulmonary opacities with architectural distortion are grossly similar to 08/08/2022 and likely due to prior infection or inflammation. this limits evaluation for superimposed acute process. 2. Small bilateral pleural effusions. Electronically signed by: Rockey Kilts MD 09/08/2023 02:06 PM EDT RP Workstation: [id number]        Scheduled Meds:  bictegravir-emtricitabine -tenofovir  AF  1 tablet Oral Daily   busPIRone   5 mg Oral TID   cholecalciferol   2,000 Units Oral Daily   dronabinol   5 mg Oral BID WC   enoxaparin  (LOVENOX ) injection  40 mg Subcutaneous Q24H   famotidine   10 mg Oral BID   gabapentin   600 mg Oral TID   multivitamin with minerals  1 tablet Oral Daily   pantoprazole   40 mg Oral BID   [START ON 09/10/2023] sulfamethoxazole -trimethoprim   1 tablet Oral Once per day on Monday Wednesday Friday   thiamine   100 mg Oral Daily   traZODone   75 mg Oral QHS   venlafaxine  XR  37.5 mg Oral Q breakfast   Continuous Infusions:  azithromycin      cefTRIAXone   (ROCEPHIN )  IV 2 g (09/09/23 0926)     LOS: 0 days     Devaughn KATHEE Ban, MD [facility]   If 7PM-7AM, please contact night-coverage [email or web address] Password [email or web address] 09/09/2023, 11:28 AM

## 2023-09-09 NOTE — Progress Notes (Signed)
Discharge instructions, RX's and follow up appts explained and provided to patient verbalized understanding. Patient left floor via wheelchair accompanied by staff. No c/o pain or shortness of breath at d/c.  Seriyah Collison Lynn, RN  

## 2023-09-09 NOTE — Assessment & Plan Note (Signed)
 Likely severe, with BMI 15 Dietary consult

## 2023-09-09 NOTE — Assessment & Plan Note (Signed)
 Liver and other metastases No acute disease suspected at this time

## 2023-09-10 LAB — T-HELPER CELLS CD4/CD8 %
% CD 4 Pos. Lymph.: 7.1 % — ABNORMAL LOW (ref 30.8–58.5)
Absolute CD 4 Helper: 135 /uL — ABNORMAL LOW (ref 359–1519)
Basophils Absolute: 0 x10E3/uL (ref 0.0–0.2)
Basos: 1 %
CD3+CD4+ Cells/CD3+CD8+ Cells Bld: 0.1 — ABNORMAL LOW (ref 0.92–3.72)
CD3+CD8+ Cells # Bld: 1395 /uL — ABNORMAL HIGH (ref 109–897)
CD3+CD8+ Cells NFr Bld: 73.4 % — ABNORMAL HIGH (ref 12.0–35.5)
EOS (ABSOLUTE): 0 x10E3/uL (ref 0.0–0.4)
Eos: 1 %
Hematocrit: 37.5 % (ref 37.5–51.0)
Hemoglobin: 11.2 g/dL — ABNORMAL LOW (ref 13.0–17.7)
Immature Grans (Abs): 0 x10E3/uL (ref 0.0–0.1)
Immature Granulocytes: 0 %
Lymphocytes Absolute: 1.9 x10E3/uL (ref 0.7–3.1)
Lymphs: 53 %
MCH: 29.1 pg (ref 26.6–33.0)
MCHC: 29.9 g/dL — ABNORMAL LOW (ref 31.5–35.7)
MCV: 97 fL (ref 79–97)
Monocytes Absolute: 0.3 x10E3/uL (ref 0.1–0.9)
Monocytes: 8 %
NRBC: 1 % — ABNORMAL HIGH (ref 0–0)
Neutrophils Absolute: 1.3 x10E3/uL — ABNORMAL LOW (ref 1.4–7.0)
Neutrophils: 37 %
Platelets: 97 x10E3/uL — ABNORMAL LOW (ref 150–450)
RBC: 3.85 x10E6/uL — ABNORMAL LOW (ref 4.14–5.80)
RDW: 14 % (ref 11.6–15.4)
WBC: 3.6 x10E3/uL (ref 3.4–10.8)

## 2023-09-10 LAB — RESPIRATORY PANEL BY PCR

## 2023-09-10 LAB — IGG 1, 2, 3, AND 4
IgG (Immunoglobin G), Serum: 4884 mg/dL — ABNORMAL HIGH (ref 603–1613)
IgG, Subclass 1: 2891 mg/dL — ABNORMAL HIGH (ref 248–810)
IgG, Subclass 2: 433 mg/dL (ref 130–555)
IgG, Subclass 3: >199 mg/dL — ABNORMAL HIGH (ref 15–102)
IgG, Subclass 4: 6 mg/dL (ref 2–96)

## 2023-09-10 LAB — HIV-1 RNA QUANT-NO REFLEX-BLD
HIV 1 RNA Quant: 890 {copies}/mL
LOG10 HIV-1 RNA: 2.949 {Log_copies}/mL

## 2023-09-10 LAB — RPR: RPR Ser Ql: NONREACTIVE

## 2023-09-13 LAB — CULTURE, BLOOD (ROUTINE X 2)
Culture: NO GROWTH
Culture: NO GROWTH
Special Requests: ADEQUATE

## 2023-09-22 ENCOUNTER — Emergency Department: Payer: Medicaid - Out of State

## 2023-09-22 ENCOUNTER — Other Ambulatory Visit: Payer: Self-pay

## 2023-09-22 ENCOUNTER — Emergency Department
Admission: EM | Admit: 2023-09-22 | Discharge: 2023-09-22 | Disposition: A | Discharge status: Home / Self Care | Payer: Medicaid - Out of State | Attending: Emergency Medicine | Admitting: Emergency Medicine

## 2023-09-22 DIAGNOSIS — D72819 Decreased white blood cell count, unspecified: Secondary | ICD-10-CM | POA: Diagnosis not present

## 2023-09-22 DIAGNOSIS — Z85831 Personal history of malignant neoplasm of soft tissue: Secondary | ICD-10-CM | POA: Insufficient documentation

## 2023-09-22 DIAGNOSIS — R079 Chest pain, unspecified: Secondary | ICD-10-CM | POA: Insufficient documentation

## 2023-09-22 DIAGNOSIS — D696 Thrombocytopenia, unspecified: Secondary | ICD-10-CM | POA: Diagnosis not present

## 2023-09-22 DIAGNOSIS — Z21 Asymptomatic human immunodeficiency virus [HIV] infection status: Secondary | ICD-10-CM | POA: Diagnosis not present

## 2023-09-22 DIAGNOSIS — R0602 Shortness of breath: Secondary | ICD-10-CM | POA: Diagnosis not present

## 2023-09-22 DIAGNOSIS — C469 Kaposi's sarcoma, unspecified: Secondary | ICD-10-CM

## 2023-09-22 DIAGNOSIS — E876 Hypokalemia: Secondary | ICD-10-CM | POA: Diagnosis not present

## 2023-09-22 LAB — CBC
HCT: 41.7 % (ref 39.0–52.0)
Hemoglobin: 13 g/dL (ref 13.0–17.0)
MCH: 29 pg (ref 26.0–34.0)
MCHC: 31.2 g/dL (ref 30.0–36.0)
MCV: 92.9 fL (ref 80.0–100.0)
Platelets: 63 K/uL — ABNORMAL LOW (ref 150–400)
RBC: 4.49 MIL/uL (ref 4.22–5.81)
RDW: 16.7 % — ABNORMAL HIGH (ref 11.5–15.5)
WBC: 3.3 K/uL — ABNORMAL LOW (ref 4.0–10.5)
nRBC: 5.1 % — ABNORMAL HIGH (ref 0.0–0.2)

## 2023-09-22 LAB — BASIC METABOLIC PANEL WITH GFR
Anion gap: 11 (ref 5–15)
BUN: 10 mg/dL (ref 6–20)
CO2: 25 mmol/L (ref 22–32)
Calcium: 7 mg/dL — ABNORMAL LOW (ref 8.9–10.3)
Chloride: 99 mmol/L (ref 98–111)
Creatinine, Ser: 0.57 mg/dL — ABNORMAL LOW (ref 0.61–1.24)
GFR, Estimated: >60 mL/min (ref >60)
Glucose, Bld: 74 mg/dL (ref 70–99)
Potassium: 3.2 mmol/L — ABNORMAL LOW (ref 3.5–5.1)
Sodium: 135 mmol/L (ref 135–145)

## 2023-09-22 LAB — CBC WITH DIFFERENTIAL/PLATELET
Abs Immature Granulocytes: 0.1 K/uL — ABNORMAL HIGH (ref 0.00–0.07)
Basophils Absolute: 0.1 K/uL (ref 0.0–0.1)
Basophils Relative: 1 %
Eosinophils Absolute: 0 K/uL (ref 0.0–0.5)
Eosinophils Relative: 0 %
HCT: 34 % — ABNORMAL LOW (ref 39.0–52.0)
Hemoglobin: 10.9 g/dL — ABNORMAL LOW (ref 13.0–17.0)
Immature Granulocytes: 2 %
Lymphocytes Relative: 48 %
Lymphs Abs: 2.8 K/uL (ref 0.7–4.0)
MCH: 29.6 pg (ref 26.0–34.0)
MCHC: 32.1 g/dL (ref 30.0–36.0)
MCV: 92.4 fL (ref 80.0–100.0)
Monocytes Absolute: 0.5 K/uL (ref 0.1–1.0)
Monocytes Relative: 9 %
Neutro Abs: 2.3 K/uL (ref 1.7–7.7)
Neutrophils Relative %: 40 %
Platelets: 109 K/uL — ABNORMAL LOW (ref 150–400)
RBC: 3.68 MIL/uL — ABNORMAL LOW (ref 4.22–5.81)
RDW: 16.6 % — ABNORMAL HIGH (ref 11.5–15.5)
WBC: 5.8 K/uL (ref 4.0–10.5)
nRBC: 2.4 % — ABNORMAL HIGH (ref 0.0–0.2)

## 2023-09-22 LAB — TROPONIN I (HIGH SENSITIVITY)
Troponin I (High Sensitivity): 10 ng/L (ref <18)
Troponin I (High Sensitivity): 11 ng/L (ref <18)

## 2023-09-22 MED ORDER — POTASSIUM CHLORIDE CRYS ER 20 MEQ PO TBCR
40.0000 meq | EXTENDED_RELEASE_TABLET | Freq: Once | ORAL | Status: AC
Start: 2023-09-22 — End: 2023-09-22
  Administered 2023-09-22: 40 meq via ORAL
  Filled 2023-09-22: qty 2

## 2023-09-22 MED ORDER — IOHEXOL 350 MG/ML SOLN
75.0000 mL | Freq: Once | INTRAVENOUS | Status: AC | PRN
  Administered 2023-09-22: 53 mL via INTRAVENOUS

## 2023-09-22 MED ORDER — MORPHINE SULFATE (PF) 4 MG/ML IV SOLN
4.0000 mg | Freq: Once | INTRAVENOUS | Status: AC
  Administered 2023-09-22: 4 mg via INTRAVENOUS
  Filled 2023-09-22: qty 1

## 2023-09-22 NOTE — Discharge Instructions (Addendum)
 You were seen in our emergency department for acutely worsening chest pain.  Workup today demonstrated no new acute findings however you have disseminated Kaposi's sarcoma's throughout your body.  As explained during your last hospitalization you need to follow-up with infectious disease as soon as possible at a Kaposi center.  Infectious disease at our institution recommended [place]  [facility].  Please return with any acutely worsening symptoms or any other emergency. -- RETURN PRECAUTIONS & AFTERCARE: (ENGLISH) RETURN PRECAUTIONS: Return immediately to the emergency department or see/call your doctor if you feel worse, weak or have changes in speech or vision, are short of breath, have fever, vomiting, pain, bleeding or dark stool, trouble urinating or any new issues. Return here or see/call your doctor if not improving as expected for your suspected condition. FOLLOW-UP CARE: Call your doctor and/or any doctors we referred you to for more advice and to make an appointment. Do this today, tomorrow or after the weekend. Some doctors only take PPO insurance so if you have HMO insurance you may want to contact your HMO or your regular doctor for referral to a specialist within your plan. Either way tell the doctor's office that it was a referral from the emergency department so you get the soonest possible appointment.  YOUR TEST RESULTS: Take result reports of any blood or urine tests, imaging tests and EKG's to your doctor and any referral doctor. Have any abnormal tests repeated. Your doctor or a referral doctor can let you know when this should be done. Also make sure your doctor contacts this hospital to get any test results that are not currently available such as cultures or special tests for infection and final imaging reports, which are often not available at the time you leave the ER but which may list additional important findings that are not documented on the preliminary report. BLOOD  PRESSURE: If your blood pressure was greater than 120/80 have your blood pressure rechecked within 1 to 2 weeks. MEDICATION SIDE EFFECTS: Do not drive, walk, bike, take the bus, etc. if you have received or are being prescribed any sedating medications such as those for pain or anxiety or certain antihistamines like Benadryl . If you have been give one of these here get a taxi home or have a friend drive you home. Ask your pharmacist to counsel you on potential side effects of any new medication

## 2023-09-22 NOTE — ED Notes (Signed)
 Patient to CT.

## 2023-09-22 NOTE — ED Provider Notes (Signed)
 Clinical Course as of 09/22/23 1828  Mon Sep 22, 2023  1511 Pending CTA of chest. No AIDS with Kaposi. Dc here. Didn't follow up as outpatient. He is on antiretrovirals. Bodyaches. New chest pain. On ppx bactrim .  [HD]  1531 Per ID note [HD]  1826 I reviewed the results of the CT scan with the patient and he voiced understanding.  I emphasized the fact that he needs to go to a Kaposi center and he still has the discharge paperwork from the last time he was here and feels that he has the information to get to [facility].  All questions answered and patient voiced understanding and requested discharge [HD]    Clinical Course User Index [HD] Nicholaus Rolland BRAVO, MD   At time of discharge there is no evidence of acute life, limb, vision, or fertility threat. Patient has stable vital signs, pain is well controlled, patient is ambulatory and p.o. tolerant.  Discharge instructions were completed using the EPIC system. I would refer you to those at this time. All warnings prescriptions follow-up etc. were discussed in detail with the patient. Patient indicates understanding and is agreeable with this plan. All questions answered.  Patient is made aware that they may return to the emergency department for any worsening or new condition or for any other emergency.    Nicholaus Rolland BRAVO, MD 09/22/23 2001

## 2023-09-22 NOTE — ED Triage Notes (Signed)
 Pt arrives via EMS from home due to pain. Pt sts that he has been having generalized pain as well as chest pain.  Pt sts that he has not started back on his chemo yet.

## 2023-09-22 NOTE — ED Provider Notes (Signed)
 [facility] Provider Note    Event Date/Time   First MD Initiated Contact with Patient 09/22/23 1357     (approximate)   History   Generalized Body Aches   HPI  Warren Farrell is a 33 year old with history of AIDS with Kaposi's sarcoma, on Bactrim  prophylaxis, chronic respiratory failure on 2 L presenting to the emergency department for evaluation of chest pain.  Patient reports he has not yet followed up with oncology to start treatment for his Kaposi's sarcoma.  He reports ongoing body aches which he says is not new for him, but does report that his chest pain is new described as a sharp, stabbing pain in his chest, constant, nonradiating.  Does report associated shortness of breath.  I reviewed his discharge summary from 09/09/2023.  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  It was noted that patient can follow-up locally with ID, but will need follow-up at a specialist center regarding his Kaposi's sarcoma as local oncologist do not treat this.     Physical Exam   Triage Vital Signs: ED Triage Vitals  Encounter Vitals Group     BP 09/22/23 1212 (!) 130/100     Girls Systolic BP Percentile --      Girls Diastolic BP Percentile --      Boys Systolic BP Percentile --      Boys Diastolic BP Percentile --      Pulse Rate 09/22/23 1212 (!) 117     Resp 09/22/23 1212 19     Temp 09/22/23 1212 97.9 F (36.6 C)     Temp Source 09/22/23 1212 Oral     SpO2 09/22/23 1212 99 %     Weight 09/22/23 1213 113 lb (51.3 kg)     Height 09/22/23 1213 6' (1.829 m)     Head Circumference --      Peak Flow --      Pain Score 09/22/23 1212 9     Pain Loc --      Pain Education --      Exclude from Growth Chart --     Most recent vital signs: Vitals:   09/22/23 1212  BP: (!) 130/100  Pulse: (!) 117  Resp: 19   Temp: 97.9 F (36.6 C)  SpO2: 99%     General: Awake, interactive  CV:  Regular rate at the time of my evaluation, good peripheral perfusion.  Resp:  Unlabored respirations, lung sounds mildly coarse bilaterally, satting in the upper 90s on his home 2 L Abd:  Nondistended, soft, mild generalized tenderness without focal tenderness Neuro:  Symmetric facial movement, fluid speech   ED Results / Procedures / Treatments   Labs (all labs ordered are listed, but only abnormal results are displayed) Labs Reviewed  BASIC METABOLIC PANEL WITH GFR - Abnormal; Notable for the following components:      Result Value   Potassium 3.2 (*)    Creatinine, Ser 0.57 (*)    Calcium 7.0 (*)    All other components within normal limits  CBC - Abnormal; Notable for the following components:   WBC 3.3 (*)    RDW 16.7 (*)    Platelets 63 (*)    nRBC 5.1 (*)    All other components within normal limits  CBC WITH DIFFERENTIAL/PLATELET  TROPONIN I (HIGH SENSITIVITY)  TROPONIN I (HIGH SENSITIVITY)     EKG EKG independently reviewed and interpreted by myself demonstrates:  EKG demonstrate sinus tachycardia rate of 108, PR 186, QRS 96, QTc 471, nonspecific ST changes noted, no STEMI  RADIOLOGY Imaging independently reviewed and interpreted by myself demonstrates:  CXR with stable opacities  Formal Radiology Read:  DG Chest 2 View Result Date: 09/22/2023 CLINICAL DATA:  chest pain. EXAM: CHEST - 2 VIEW COMPARISON:  09/08/2023. FINDINGS: Redemonstration of heterogeneous nonspecific opacities throughout bilateral lungs with upper lobe predominance. No significant interval change. No acute consolidation in the interim. Redemonstration of bilateral small pleural effusions without significant interval change as well. Surgical suture noted overlying the right lung apex. There is bilateral apical smooth opacification, which may represent pleural thickening versus effusion. Stable cardio-mediastinal  silhouette. No acute osseous abnormalities. The soft tissues are within normal limits. IMPRESSION: No significant interval change since the prior study. Redemonstration of heterogeneous nonspecific opacities throughout bilateral lungs with upper lobe predominance. No acute consolidation in the interim. Redemonstration of bilateral small pleural effusions without significant interval change as well. Electronically Signed   By: Ree Molt M.D.   On: 09/22/2023 12:54    PROCEDURES:  Critical Care performed: No  Procedures   MEDICATIONS ORDERED IN ED: Medications  morphine  (PF) 4 MG/ML injection 4 mg (has no administration in time range)  potassium chloride  SA (KLOR-CON  M) CR tablet 40 mEq (has no administration in time range)     IMPRESSION / MDM / ASSESSMENT AND PLAN / ED COURSE  I reviewed the triage vital signs and the nursing notes.  Differential diagnosis includes, but is not limited to, ACS, pneumonia, pneumothorax, PE, symptoms from known Kaposi's sarcoma with disseminated disease  Patient's presentation is most consistent with acute presentation with potential threat to life or bodily function.  33 year old male presenting to the emergency department for evaluation of chest pain and shortness of breath.  Tachycardic on presentation but normal heart rate at the time of my evaluation.  Patient tells me he has had ongoing body aches, but his chest pain is new with associated shortness of breath.   Labs with CBC with mild leukopenia, will obtain differential to further assess.  Normal hemoglobin, but does have worsening thrombocytopenia with platelet count of 63.  BMP with mild hypokalemia.  Negative initial troponin. I do note that he had a CTA PE protocol on 8/4 without evidence of PE, but patient tells me that his chest pain is new since that time. With this, we will plan for repeat CTA.    Signout to oncoming physician at 1500 pending CTA and disposition.   FINAL CLINICAL  IMPRESSION(S) / ED DIAGNOSES   Final diagnoses:  Acute chest pain  Shortness of breath     Rx / DC Orders   ED Discharge Orders     None        Note:  This document was prepared using Dragon voice recognition software and may include unintentional dictation errors.   Levander Slate, MD 09/22/23 [phone]

## 2023-09-23 ENCOUNTER — Inpatient Hospital Stay: Payer: Self-pay | Admitting: Infectious Diseases

## 2023-10-09 ENCOUNTER — Emergency Department: Payer: Medicaid - Out of State

## 2023-10-09 ENCOUNTER — Telehealth: Payer: Self-pay | Admitting: Infectious Diseases

## 2023-10-09 ENCOUNTER — Other Ambulatory Visit: Payer: Self-pay

## 2023-10-09 ENCOUNTER — Inpatient Hospital Stay: Payer: Medicaid - Out of State

## 2023-10-09 ENCOUNTER — Inpatient Hospital Stay
Admission: EM | Admit: 2023-10-09 | Discharge: 2023-11-05 | DRG: 974 | Disposition: E | Payer: Medicaid - Out of State | Attending: Osteopathic Medicine | Admitting: Osteopathic Medicine

## 2023-10-09 DIAGNOSIS — F32A Depression, unspecified: Secondary | ICD-10-CM | POA: Diagnosis present

## 2023-10-09 DIAGNOSIS — E43 Unspecified severe protein-calorie malnutrition: Secondary | ICD-10-CM | POA: Diagnosis present

## 2023-10-09 DIAGNOSIS — D696 Thrombocytopenia, unspecified: Secondary | ICD-10-CM | POA: Diagnosis present

## 2023-10-09 DIAGNOSIS — C3490 Malignant neoplasm of unspecified part of unspecified bronchus or lung: Secondary | ICD-10-CM | POA: Diagnosis present

## 2023-10-09 DIAGNOSIS — Q821 Xeroderma pigmentosum: Secondary | ICD-10-CM

## 2023-10-09 DIAGNOSIS — C7802 Secondary malignant neoplasm of left lung: Secondary | ICD-10-CM | POA: Diagnosis present

## 2023-10-09 DIAGNOSIS — C779 Secondary and unspecified malignant neoplasm of lymph node, unspecified: Secondary | ICD-10-CM | POA: Diagnosis present

## 2023-10-09 DIAGNOSIS — J9621 Acute and chronic respiratory failure with hypoxia: Secondary | ICD-10-CM | POA: Diagnosis not present

## 2023-10-09 DIAGNOSIS — R64 Cachexia: Secondary | ICD-10-CM | POA: Diagnosis present

## 2023-10-09 DIAGNOSIS — R531 Weakness: Principal | ICD-10-CM

## 2023-10-09 DIAGNOSIS — Z792 Long term (current) use of antibiotics: Secondary | ICD-10-CM

## 2023-10-09 DIAGNOSIS — R52 Pain, unspecified: Secondary | ICD-10-CM | POA: Diagnosis not present

## 2023-10-09 DIAGNOSIS — Z515 Encounter for palliative care: Secondary | ICD-10-CM | POA: Diagnosis not present

## 2023-10-09 DIAGNOSIS — Z5986 Financial insecurity: Secondary | ICD-10-CM

## 2023-10-09 DIAGNOSIS — Z66 Do not resuscitate: Secondary | ICD-10-CM | POA: Diagnosis not present

## 2023-10-09 DIAGNOSIS — K769 Liver disease, unspecified: Secondary | ICD-10-CM | POA: Diagnosis present

## 2023-10-09 DIAGNOSIS — G9341 Metabolic encephalopathy: Secondary | ICD-10-CM | POA: Diagnosis not present

## 2023-10-09 DIAGNOSIS — R579 Shock, unspecified: Secondary | ICD-10-CM | POA: Diagnosis present

## 2023-10-09 DIAGNOSIS — R6511 Systemic inflammatory response syndrome (SIRS) of non-infectious origin with acute organ dysfunction: Secondary | ICD-10-CM | POA: Diagnosis not present

## 2023-10-09 DIAGNOSIS — Z5971 Insufficient health insurance coverage: Secondary | ICD-10-CM

## 2023-10-09 DIAGNOSIS — R627 Adult failure to thrive: Secondary | ICD-10-CM | POA: Diagnosis present

## 2023-10-09 DIAGNOSIS — D539 Nutritional anemia, unspecified: Secondary | ICD-10-CM | POA: Diagnosis present

## 2023-10-09 DIAGNOSIS — Z604 Social exclusion and rejection: Secondary | ICD-10-CM | POA: Diagnosis present

## 2023-10-09 DIAGNOSIS — E8809 Other disorders of plasma-protein metabolism, not elsewhere classified: Secondary | ICD-10-CM | POA: Diagnosis present

## 2023-10-09 DIAGNOSIS — C7801 Secondary malignant neoplasm of right lung: Secondary | ICD-10-CM | POA: Diagnosis present

## 2023-10-09 DIAGNOSIS — Z9221 Personal history of antineoplastic chemotherapy: Secondary | ICD-10-CM

## 2023-10-09 DIAGNOSIS — C469 Kaposi's sarcoma, unspecified: Secondary | ICD-10-CM

## 2023-10-09 DIAGNOSIS — Z86711 Personal history of pulmonary embolism: Secondary | ICD-10-CM

## 2023-10-09 DIAGNOSIS — Z1152 Encounter for screening for COVID-19: Secondary | ICD-10-CM

## 2023-10-09 DIAGNOSIS — B20 Human immunodeficiency virus [HIV] disease: Secondary | ICD-10-CM | POA: Diagnosis present

## 2023-10-09 DIAGNOSIS — Z681 Body mass index (BMI) 19 or less, adult: Secondary | ICD-10-CM

## 2023-10-09 DIAGNOSIS — R310 Gross hematuria: Secondary | ICD-10-CM | POA: Diagnosis present

## 2023-10-09 DIAGNOSIS — C7951 Secondary malignant neoplasm of bone: Secondary | ICD-10-CM | POA: Diagnosis present

## 2023-10-09 DIAGNOSIS — Z79899 Other long term (current) drug therapy: Secondary | ICD-10-CM

## 2023-10-09 DIAGNOSIS — G893 Neoplasm related pain (acute) (chronic): Secondary | ICD-10-CM | POA: Diagnosis present

## 2023-10-09 DIAGNOSIS — N289 Disorder of kidney and ureter, unspecified: Secondary | ICD-10-CM | POA: Diagnosis present

## 2023-10-09 DIAGNOSIS — R008 Other abnormalities of heart beat: Secondary | ICD-10-CM | POA: Diagnosis not present

## 2023-10-09 DIAGNOSIS — E162 Hypoglycemia, unspecified: Secondary | ICD-10-CM | POA: Diagnosis present

## 2023-10-09 LAB — TROPONIN I (HIGH SENSITIVITY)
Troponin I (High Sensitivity): 13 ng/L (ref <18)
Troponin I (High Sensitivity): 14 ng/L (ref <18)

## 2023-10-09 LAB — CBC WITH DIFFERENTIAL/PLATELET
Abs Immature Granulocytes: 0.11 K/uL — ABNORMAL HIGH (ref 0.00–0.07)
Basophils Absolute: 0 K/uL (ref 0.0–0.1)
Basophils Relative: 1 %
Eosinophils Absolute: 0 K/uL (ref 0.0–0.5)
Eosinophils Relative: 0 %
HCT: 33 % — ABNORMAL LOW (ref 39.0–52.0)
Hemoglobin: 10.4 g/dL — ABNORMAL LOW (ref 13.0–17.0)
Immature Granulocytes: 2 %
Lymphocytes Relative: 32 %
Lymphs Abs: 1.6 K/uL (ref 0.7–4.0)
MCH: 30.5 pg (ref 26.0–34.0)
MCHC: 31.5 g/dL (ref 30.0–36.0)
MCV: 96.8 fL (ref 80.0–100.0)
Monocytes Absolute: 0.4 K/uL (ref 0.1–1.0)
Monocytes Relative: 7 %
Neutro Abs: 2.9 K/uL (ref 1.7–7.7)
Neutrophils Relative %: 58 %
Platelets: 79 K/uL — ABNORMAL LOW (ref 150–400)
RBC: 3.41 MIL/uL — ABNORMAL LOW (ref 4.22–5.81)
RDW: 16.8 % — ABNORMAL HIGH (ref 11.5–15.5)
WBC: 5 K/uL (ref 4.0–10.5)
nRBC: 5 % — ABNORMAL HIGH (ref 0.0–0.2)

## 2023-10-09 LAB — CREATININE, SERUM
Creatinine, Ser: 0.56 mg/dL — ABNORMAL LOW (ref 0.61–1.24)
GFR, Estimated: >60 mL/min (ref >60)

## 2023-10-09 LAB — CBC
HCT: 33.3 % — ABNORMAL LOW (ref 39.0–52.0)
Hemoglobin: 10.4 g/dL — ABNORMAL LOW (ref 13.0–17.0)
MCH: 30.8 pg (ref 26.0–34.0)
MCHC: 31.2 g/dL (ref 30.0–36.0)
MCV: 98.5 fL (ref 80.0–100.0)
Platelets: 83 K/uL — ABNORMAL LOW (ref 150–400)
RBC: 3.38 MIL/uL — ABNORMAL LOW (ref 4.22–5.81)
RDW: 16.4 % — ABNORMAL HIGH (ref 11.5–15.5)
WBC: 4.6 K/uL (ref 4.0–10.5)
nRBC: 5.4 % — ABNORMAL HIGH (ref 0.0–0.2)

## 2023-10-09 LAB — COMPREHENSIVE METABOLIC PANEL WITH GFR
ALT: 23 U/L (ref 0–44)
AST: 141 U/L — ABNORMAL HIGH (ref 15–41)
Albumin: 1.9 g/dL — ABNORMAL LOW (ref 3.5–5.0)
Alkaline Phosphatase: 495 U/L — ABNORMAL HIGH (ref 38–126)
Anion gap: 11 (ref 5–15)
BUN: 11 mg/dL (ref 6–20)
CO2: 28 mmol/L (ref 22–32)
Calcium: 6.9 mg/dL — ABNORMAL LOW (ref 8.9–10.3)
Chloride: 98 mmol/L (ref 98–111)
Creatinine, Ser: 0.46 mg/dL — ABNORMAL LOW (ref 0.61–1.24)
GFR, Estimated: >60 mL/min (ref >60)
Glucose, Bld: 62 mg/dL — ABNORMAL LOW (ref 70–99)
Potassium: 3.4 mmol/L — ABNORMAL LOW (ref 3.5–5.1)
Sodium: 137 mmol/L (ref 135–145)
Total Bilirubin: 1.8 mg/dL — ABNORMAL HIGH (ref 0.0–1.2)
Total Protein: 8.5 g/dL — ABNORMAL HIGH (ref 6.5–8.1)

## 2023-10-09 LAB — URINALYSIS, ROUTINE W REFLEX MICROSCOPIC
Bilirubin Urine: NEGATIVE
Glucose, UA: NEGATIVE mg/dL
Ketones, ur: 20 mg/dL — AB
Leukocytes,Ua: NEGATIVE
Nitrite: NEGATIVE
Protein, ur: >=300 mg/dL — AB
Specific Gravity, Urine: 1.025 (ref 1.005–1.030)
pH: 6 (ref 5.0–8.0)

## 2023-10-09 LAB — CBG MONITORING, ED
Glucose-Capillary: 49 mg/dL — ABNORMAL LOW (ref 70–99)
Glucose-Capillary: 87 mg/dL (ref 70–99)
Glucose-Capillary: 78 mg/dL (ref 70–99)
Glucose-Capillary: 77 mg/dL (ref 70–99)

## 2023-10-09 MED ORDER — THIAMINE MONONITRATE 100 MG PO TABS
100.0000 mg | ORAL_TABLET | Freq: Every day | ORAL | Status: DC
  Administered 2023-10-10 – 2023-10-11 (×2): 100 mg via ORAL
  Filled 2023-10-09 (×3): qty 1

## 2023-10-09 MED ORDER — HEPARIN SODIUM (PORCINE) 5000 UNIT/ML IJ SOLN: 5000.0000 [IU] | Freq: Three times a day (TID) | INTRAMUSCULAR | Status: DC

## 2023-10-09 MED ORDER — HYDROMORPHONE HCL 1 MG/ML IJ SOLN
0.5000 mg | Freq: Once | INTRAMUSCULAR | Status: AC
  Administered 2023-10-09: 0.5 mg via INTRAVENOUS
  Filled 2023-10-09: qty 0.5

## 2023-10-09 MED ORDER — LIDOCAINE 5 % EX PTCH
1.0000 | MEDICATED_PATCH | CUTANEOUS | Status: DC
  Administered 2023-10-09 – 2023-10-10 (×2): 2 via TRANSDERMAL
  Administered 2023-10-11: 1 via TRANSDERMAL
  Filled 2023-10-09 (×4): qty 2

## 2023-10-09 MED ORDER — SULFAMETHOXAZOLE-TRIMETHOPRIM 800-160 MG PO TABS: 1.0000 | ORAL_TABLET | ORAL | Status: DC

## 2023-10-09 MED ORDER — PANTOPRAZOLE SODIUM 40 MG PO TBEC: 40.0000 mg | DELAYED_RELEASE_TABLET | Freq: Two times a day (BID) | ORAL | Status: DC

## 2023-10-09 MED ORDER — TRAZODONE HCL 50 MG PO TABS
75.0000 mg | ORAL_TABLET | Freq: Every day | ORAL | Status: DC
Start: 2023-10-09 — End: 2023-10-12
  Administered 2023-10-09 – 2023-10-10 (×2): 75 mg via ORAL
  Filled 2023-10-09 (×2): qty 2

## 2023-10-09 MED ORDER — POTASSIUM CHLORIDE CRYS ER 20 MEQ PO TBCR
20.0000 meq | EXTENDED_RELEASE_TABLET | Freq: Once | ORAL | Status: AC
  Administered 2023-10-09: 20 meq via ORAL
  Filled 2023-10-09: qty 1

## 2023-10-09 MED ORDER — HYDROMORPHONE HCL 1 MG/ML IJ SOLN
0.5000 mg | INTRAMUSCULAR | Status: DC | PRN
  Administered 2023-10-09 – 2023-10-10 (×7): 1 mg via INTRAVENOUS
  Filled 2023-10-09 (×7): qty 1

## 2023-10-09 MED ORDER — VENLAFAXINE HCL ER 37.5 MG PO CP24
37.5000 mg | ORAL_CAPSULE | Freq: Every day | ORAL | Status: DC
  Administered 2023-10-10 – 2023-10-11 (×2): 37.5 mg via ORAL
  Filled 2023-10-09 (×3): qty 1

## 2023-10-09 MED ORDER — ONDANSETRON HCL 4 MG/2ML IJ SOLN
4.0000 mg | Freq: Four times a day (QID) | INTRAMUSCULAR | Status: DC | PRN
Start: 2023-10-09 — End: 2023-10-12
  Administered 2023-10-09 – 2023-10-10 (×2): 4 mg via INTRAVENOUS
  Filled 2023-10-09 (×2): qty 2

## 2023-10-09 MED ORDER — ONDANSETRON HCL 4 MG/2ML IJ SOLN
4.0000 mg | Freq: Once | INTRAMUSCULAR | Status: AC
  Administered 2023-10-09: 4 mg via INTRAVENOUS
  Filled 2023-10-09: qty 2

## 2023-10-09 MED ORDER — DIPHENHYDRAMINE HCL 50 MG/ML IJ SOLN
25.0000 mg | Freq: Four times a day (QID) | INTRAMUSCULAR | Status: DC | PRN
  Administered 2023-10-09 – 2023-10-13 (×3): 25 mg via INTRAVENOUS
  Filled 2023-10-09 (×3): qty 1

## 2023-10-09 MED ORDER — METOPROLOL TARTRATE 25 MG PO TABS
12.5000 mg | ORAL_TABLET | Freq: Two times a day (BID) | ORAL | Status: DC
  Administered 2023-10-10 – 2023-10-11 (×3): 12.5 mg via ORAL
  Filled 2023-10-09 (×3): qty 1

## 2023-10-09 MED ORDER — IBUPROFEN 400 MG PO TABS: 400.0000 mg | ORAL_TABLET | Freq: Four times a day (QID) | ORAL | Status: DC | PRN

## 2023-10-09 MED ORDER — BICTEGRAVIR-EMTRICITAB-TENOFOV 50-200-25 MG PO TABS
1.0000 | ORAL_TABLET | Freq: Every day | ORAL | Status: DC
  Administered 2023-10-10 – 2023-10-11 (×2): 1 via ORAL
  Filled 2023-10-09 (×3): qty 1

## 2023-10-09 MED ORDER — FAMOTIDINE 20 MG PO TABS
10.0000 mg | ORAL_TABLET | Freq: Two times a day (BID) | ORAL | Status: DC
  Administered 2023-10-10 – 2023-10-11 (×3): 10 mg via ORAL
  Filled 2023-10-09 (×4): qty 1

## 2023-10-09 MED ORDER — METHOCARBAMOL 500 MG PO TABS
500.0000 mg | ORAL_TABLET | Freq: Four times a day (QID) | ORAL | Status: DC | PRN
  Administered 2023-10-09: 500 mg via ORAL
  Filled 2023-10-09: qty 1

## 2023-10-09 MED ORDER — DEXTROSE IN LACTATED RINGERS 5 % IV SOLN: INTRAVENOUS | Status: DC

## 2023-10-09 MED ORDER — DRONABINOL 5 MG PO CAPS
5.0000 mg | ORAL_CAPSULE | Freq: Two times a day (BID) | ORAL | Status: DC
  Administered 2023-10-09 – 2023-10-11 (×4): 5 mg via ORAL
  Filled 2023-10-09 (×5): qty 2

## 2023-10-09 MED ORDER — BUSPIRONE HCL 10 MG PO TABS
5.0000 mg | ORAL_TABLET | Freq: Three times a day (TID) | ORAL | Status: DC
  Administered 2023-10-09 – 2023-10-11 (×6): 5 mg via ORAL
  Filled 2023-10-09 (×7): qty 1

## 2023-10-09 MED ORDER — ADULT MULTIVITAMIN W/MINERALS CH
1.0000 | ORAL_TABLET | Freq: Every day | ORAL | Status: DC
  Administered 2023-10-10 – 2023-10-11 (×2): 1 via ORAL
  Filled 2023-10-09 (×3): qty 1

## 2023-10-09 MED ORDER — MORPHINE SULFATE (PF) 4 MG/ML IV SOLN
4.0000 mg | Freq: Once | INTRAVENOUS | Status: AC
  Administered 2023-10-09: 4 mg via INTRAVENOUS
  Filled 2023-10-09: qty 1

## 2023-10-09 MED ORDER — IPRATROPIUM-ALBUTEROL 0.5-2.5 (3) MG/3ML IN SOLN
3.0000 mL | Freq: Four times a day (QID) | RESPIRATORY_TRACT | Status: DC
  Administered 2023-10-09 – 2023-10-10 (×2): 3 mL via RESPIRATORY_TRACT
  Filled 2023-10-09 (×3): qty 3

## 2023-10-09 MED ORDER — ONDANSETRON HCL 4 MG PO TABS: 4.0000 mg | ORAL_TABLET | Freq: Four times a day (QID) | ORAL | Status: DC | PRN

## 2023-10-09 MED ORDER — VITAMIN D 25 MCG (1000 UNIT) PO TABS
2000.0000 [IU] | ORAL_TABLET | Freq: Every day | ORAL | Status: DC
  Administered 2023-10-10 – 2023-10-11 (×2): 2000 [IU] via ORAL
  Filled 2023-10-09 (×3): qty 2

## 2023-10-09 MED ORDER — CALCIUM CARBONATE ANTACID 500 MG PO CHEW
3.0000 | CHEWABLE_TABLET | Freq: Three times a day (TID) | ORAL | Status: DC | PRN
  Filled 2023-10-09: qty 3

## 2023-10-09 MED ORDER — GABAPENTIN 300 MG PO CAPS
600.0000 mg | ORAL_CAPSULE | Freq: Three times a day (TID) | ORAL | Status: DC
  Administered 2023-10-09 – 2023-10-11 (×6): 600 mg via ORAL
  Filled 2023-10-09 (×8): qty 2

## 2023-10-09 MED ORDER — IOHEXOL 350 MG/ML SOLN
75.0000 mL | Freq: Once | INTRAVENOUS | Status: AC | PRN
  Administered 2023-10-09: 75 mL via INTRAVENOUS

## 2023-10-09 MED ORDER — FOLIC ACID 1 MG PO TABS
1.0000 mg | ORAL_TABLET | Freq: Every day | ORAL | Status: DC
  Administered 2023-10-10 – 2023-10-11 (×2): 1 mg via ORAL
  Filled 2023-10-09 (×3): qty 1

## 2023-10-09 MED ORDER — SULFAMETHOXAZOLE-TRIMETHOPRIM 800-160 MG PO TABS
2.0000 | ORAL_TABLET | Freq: Three times a day (TID) | ORAL | Status: DC
  Administered 2023-10-09 – 2023-10-11 (×5): 2 via ORAL
  Filled 2023-10-09 (×6): qty 2

## 2023-10-09 MED ORDER — MAGNESIUM OXIDE -MG SUPPLEMENT 400 (240 MG) MG PO TABS
400.0000 mg | ORAL_TABLET | Freq: Every day | ORAL | Status: DC
Start: 2023-10-10 — End: 2023-10-12
  Administered 2023-10-10 – 2023-10-11 (×2): 400 mg via ORAL
  Filled 2023-10-09 (×3): qty 1

## 2023-10-09 MED ORDER — SERTRALINE HCL 50 MG PO TABS
25.0000 mg | ORAL_TABLET | Freq: Every day | ORAL | Status: DC
  Administered 2023-10-10 – 2023-10-11 (×2): 25 mg via ORAL
  Filled 2023-10-09 (×3): qty 1

## 2023-10-09 NOTE — Plan of Care (Signed)

## 2023-10-09 NOTE — ED Triage Notes (Signed)
 Pt to ED [facility] from home for hypoglycemia, chest pain, back pain, hematuria, emesis. Cancer pt. Problems with insurance, has not started chemo back. Cbg 52, received tube of glucose, improvement to 56

## 2023-10-09 NOTE — ED Notes (Signed)
Pt drinking OJ

## 2023-10-09 NOTE — ED Provider Notes (Signed)
 [facility] Provider Note    Event Date/Time   First MD Initiated Contact with Patient 10/09/23 1333     (approximate)   History   Hypoglycemia   HPI  Warren Farrell is a 33 y.o. male with history of AIDS, Kaposi's sarcoma who comes in with concerns for hypoglycemia.  He is on 2 L of oxygen chronically.  He does have a history of disseminated Kaposi's sarcoma.  He was told that this needs to be followed up at a specialist center as we are not able to treat that with our oncologist.  Patient reports having continued chest pain.  He has had CT imaging on 8/14 and 8/18 that have been negative for pulmonary embolism but consistent more with metastatic disease.  He reports today that he had an episode of blood in his urine.  Patient reports continued issues with insurance has not received any treatments.  His sugar with EMS was noted to incidentally be 52 upon recheck was 56.  Patient was given some oral glucose but still had not improved with recheck here therefore patient was given some food to eat.  Patient report sometimes he just does not feel hungry.  He reports that he is able to get himself to eat though.  He reports that his back pain does seem to be diffuse along his back.  He does report some pain on his left hip as well.  He denies any new weakness or, numbness in his legs.  He reports that the main reason he is here is because of the blood in his urine and it feeling strange when he has to urinate.  Patient reports that he is interested in getting palliative care involved.  He denies being on any opioids.  He states that he has been still trying to get care at [facility] or [facility] for his cancer and that he is still open to chemotherapy but he would like to see a palliative doctor.     Physical Exam   Triage Vital Signs: ED Triage Vitals  Encounter Vitals Group     BP      Girls Systolic BP Percentile      Girls Diastolic BP Percentile      Boys Systolic BP  Percentile      Boys Diastolic BP Percentile      Pulse      Resp      Temp      Temp src      SpO2      Weight      Height      Head Circumference      Peak Flow      Pain Score      Pain Loc      Pain Education      Exclude from Growth Chart     Most recent vital signs: Vitals:   10/09/23 1415 10/09/23 1500  BP: (!) 129/94 (!) 136/95  Pulse: 70 78  Resp: 19 (!) 26  Temp:    SpO2: 99% 98%     General: Cachectic appearing male CV:  Good peripheral perfusion.  Resp:  Normal effort.  Abd:  No distention.  Soft and nontender Other:  No swelling in leg.  Nodules noted from known Kaposi's sarcoma. Equal strength.  Sensation intact in legs.   ED Results / Procedures / Treatments   Labs (all labs ordered are listed, but only abnormal results are displayed) Labs Reviewed  CBC WITH DIFFERENTIAL/PLATELET -  Abnormal; Notable for the following components:      Result Value   RBC 3.41 (*)    Hemoglobin 10.4 (*)    HCT 33.0 (*)    RDW 16.8 (*)    Platelets 79 (*)    nRBC 5.0 (*)    Abs Immature Granulocytes 0.11 (*)    All other components within normal limits  COMPREHENSIVE METABOLIC PANEL WITH GFR - Abnormal; Notable for the following components:   Potassium 3.4 (*)    Glucose, Bld 62 (*)    Creatinine, Ser 0.46 (*)    Calcium  6.9 (*)    Total Protein 8.5 (*)    Albumin 1.9 (*)    AST 141 (*)    Alkaline Phosphatase 495 (*)    Total Bilirubin 1.8 (*)    All other components within normal limits  CBG MONITORING, ED - Abnormal; Notable for the following components:   Glucose-Capillary 49 (*)    All other components within normal limits  URINALYSIS, ROUTINE W REFLEX MICROSCOPIC  CBG MONITORING, ED  TROPONIN I (HIGH SENSITIVITY)     EKG  My interpretation of EKG:  Sinus rate of 69 without any ST elevation with T wave inversions in V2 and V3, normal intervals  RADIOLOGY I have reviewed the xray personally and interpreted stable chronic  opacities.   PROCEDURES:  Critical Care performed: No  .1-3 Lead EKG Interpretation  Performed by: Ernest Ronal BRAVO, MD Authorized by: Ernest Ronal BRAVO, MD     Interpretation: normal     ECG rate:  70   ECG rate assessment: normal     Rhythm: sinus rhythm     Ectopy: none     Conduction: normal      MEDICATIONS ORDERED IN ED: Medications  HYDROmorphone  (DILAUDID ) injection 0.5 mg (0.5 mg Intravenous Given 10/09/23 1446)  ondansetron  (ZOFRAN ) injection 4 mg (4 mg Intravenous Given 10/09/23 1448)     IMPRESSION / MDM / ASSESSMENT AND PLAN / ED COURSE  I reviewed the triage vital signs and the nursing notes.   Patient's presentation is most consistent with acute presentation with potential threat to life or bodily function.   Patient comes in with concerns for hematuria.  Does report a lot of other chronic issues including his continued chest pain, back pain.  He has been seen for the chest pain multiple times with multiple CT imaging that been negative for PE he is on his baseline oxygen.  His EKG does have some T wave inversions but his troponin remains negative.  Send no evidence of DVT on examination.  And when I asked him what is the main reason he is here he reports that is more for the hematuria.  Will get urine to evaluate for UTI.  Postvoid bladder scan was normal seems less likely retention.  Ultrasound renal ordered evaluate for any obstruction, hydro.  Patient was given some Dilaudid  to help with chronic pain.  For patient's hypoglycemia we did do some oral food after having the oral glucose and sugars to come up to 87 however sugars have already started go down to 78 we will continue to trend these out to see if patient will need to be started on D10 infusion.  Patient will be handed off to oncoming team pending ultrasound, urine, trending glucose, and further disposition.  The patient is on the cardiac monitor to evaluate for evidence of arrhythmia and/or significant heart  rate changes.     FINAL CLINICAL IMPRESSION(S) / ED DIAGNOSES  Final diagnoses:  Weakness  Hypoglycemia     Rx / DC Orders   ED Discharge Orders     None        Note:  This document was prepared using Dragon voice recognition software and may include unintentional dictation errors.   Ernest Ronal BRAVO, MD 10/09/23 [phone]

## 2023-10-09 NOTE — ED Provider Notes (Addendum)
 Clinical Course as of 10/09/23 1650  Thu Oct 09, 2023  1455 Received signout: HIV/AIDS, chief complaint hematuria, urine, bladder scan, hx of multiple imaging, on home O2. Today hypoglycemic.  [HD]  1539 Urinalysis, Routine w reflex microscopic -Urine, Clean Catch(!) Not consistent with UTI, RBC without infection.  [HD]  1612 US  Renal Mildly increased echogenicity of renal parenchyma is noted bilaterally suggesting medical renal disease. No hydronephrosis or renal obstruction is noted. 2. Mildly thickened urinary bladder wall is noted most likely due to lack of distension, but cystitis cannot be excluded.  I will reevaluate the patient [HD]  1616 I had an extensive conversation with the patient and reviewed all of his results extensively.  I counseled him that I think his pain is coming from his Kaposi's sarcoma for which he has been imaged multiple occasions.  He does have pain medication at home and he states that it is not touching his symptoms.  He does not think he can manage the pain at home any longer and he does not think he will get himself to any tertiary facilities.  He is willing to explore possible comfort measures or palliative care.  Will discuss with the hospitalist bringing him in for possible intractable pain secondary to Kaposi's [HD]  1641 Hospitalist wants to touch base with oncology team here to determine course of care [HD]  1647 Dr. Marsa will come and see the patient [HD]    Clinical Course User Index [HD] Warren Rolland BRAVO, MD   Admitting Dx: Intractable pain, Kaposi sarcoma, transient hypoglycemia, general weakness   Warren Rolland BRAVO, MD 10/09/23 1650    Warren Rolland BRAVO, MD 10/09/23 1650

## 2023-10-09 NOTE — H&P (Signed)
 HISTORY AND PHYSICAL    Warren Farrell   FMW:[id number] DOB: [birth year]   Date of Service: 10/09/23 Requesting physician/APP from ED: Treatment Team:  Attending Provider: Marsa Edelman, DO  PCP: Pcp, No     HPI: Warren Farrell is a 33 y.o. 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 Problems with insurance, has not been able to follow w/ oncology / ID. Today 10/09/23 presents to ED [facility] from home for hypoglycemia, chest pain, back pain, hematuria, emesis. Cbg 52, received tube of glucose w/ EMS, improvement to 56.   In ED, VSS except mild tachypnea to max 26, 100% SpO2 on his baseline 2L [place], Glc 62, hypocalcemia 6.9, ALP elevated 495, hypoalbuminemia 1.9, mild transaminitis AST 141, GFR >60, Hgb 10.4 about baseline, thrombocytopenia Plt 79, UA (+)proteinuria >300, 0-5 RBC/hpf. Troponin neg/flat. Chronic heterogeneous opacities bilaterally with slightly increased patchy opacities in the right lower lung and left mid lung, may represent acute infection/inflammation. Renal US  concerning for medical renal disease. Hospitalist asked to admit. I spoke w/ Dr Babara oncology to confirm that cancer tx woul dbe outpatient anyway so no need for ED to ED transfer to [facility]. Admission for pain control, renal w/u, eval for PE/PNA w/ chest CT, eval abd pain / gross metastasis w/ CT abd/pelv. Pt requested palliative care discussion, full code at this time    Consultants:  Palliative care   Procedures: none      ASSESSMENT & PLAN:   Kaposi sarcoma w/ associated metastatic disease including lungs, spine, sternum which is causing chest/back pain  Opiate pain control  Palliative care consult   Question PCP pneumonia Pend CT chest - on personal review appears stable from previous  and cannot appreciate PE or obvious new infiltrate, will await radiology over-read Bactrim  PCP dose and can deescalate this to PPx dose if CT reassuring  Chronic hypoxic respiratory failure Continue O2  Hypoglycemia Poor po intake  D5LR fluids Monitor Glc   Hypocalcemia  Baseline Monitor   ALP elevated 495 mild transaminitis AST 141 Liver involvement / met disease CT abd pending - on personal review question calcifications around bladder/ureter but not certain if actually renal/ureteral stones or other? Await radiology over-read  Suspect increased w/ inflammatory condition cancer / bone involvement and can monitor   hypoalbuminemia 1.9 Severe malnutrtiion  Would supplement albumin if severe hypotension  Chronic normocytic anemia Hgb 10.4 about baseline Monitor CBC  thrombocytopenia  Plt 79 Hold anticoag/DVT Ppx unless (+)PE  UA (+)proteinuria >300 Pt reports gross hematuria however on UA here 0-5 RBC/hpf.  Renal US  c/w medical renal disease  Repeat UA/micro in AM If persistently abnormal consult nephrology    DVT prophylaxis: SCD Pertinent IV fluids/nutrition: D5LR and regular diet  Central lines / invasive devices: none  Code Status: FULL CODE Family Communication: none at this time  Disposition: inpatient TOC needs: TBD Barriers to discharge / significant pending items: see above                   Review of Systems:  Review of Systems  Constitutional:  Positive for malaise/fatigue and weight loss. Negative for chills, diaphoresis and fever.  HENT:  Negative for sinus pain and sore throat.   Respiratory:  Positive for cough and shortness of breath. Negative for hemoptysis, sputum production and wheezing.  Cardiovascular:  Positive for chest pain (worse w/ movement torso, sternum is tender). Negative for palpitations, orthopnea, claudication and leg swelling.  Gastrointestinal:  Positive for abdominal pain, diarrhea, nausea and vomiting.  Negative for blood in stool, constipation, heartburn and melena.  Genitourinary:  Positive for hematuria. Negative for dysuria and flank pain.  Musculoskeletal:  Positive for back pain, joint pain, myalgias and neck pain. Negative for falls.  Skin:  Positive for itching (generalized). Negative for rash.  Neurological:  Positive for dizziness and weakness. Negative for focal weakness, seizures and loss of consciousness.       has a past medical history of Cancer (HCC).  No current facility-administered medications on file prior to encounter.   Current Outpatient Medications on File Prior to Encounter  Medication Sig Dispense Refill   bictegravir-emtricitabine -tenofovir  AF (BIKTARVY ) 50-200-25 MG TABS tablet Take 1 tablet by mouth daily.     busPIRone  (BUSPAR ) 5 MG tablet Take 5 mg by mouth 3 (three) times daily. (Morning, noon, and bedtime)     calcium  carbonate (TUMS EX) 750 MG chewable tablet Chew 2 tablets by mouth daily.     Cholecalciferol  (VITAMIN D -1000 MAX ST) 25 MCG (1000 UT) tablet Take 2,000 Units by mouth daily.     dronabinol  (MARINOL ) 5 MG capsule Take 5 mg by mouth 2 (two) times daily with a meal.     famotidine  (PEPCID ) 10 MG tablet Take 10 mg by mouth 2 (two) times daily.     folic acid  (FOLVITE ) 1 MG tablet Take 1 mg by mouth daily.     gabapentin  (NEURONTIN ) 300 MG capsule Take 600 mg by mouth 3 (three) times daily. (Morning, noon, and bedtime)     ibuprofen  (ADVIL ) 400 MG tablet Take 400 mg by mouth every 6 (six) hours as needed.     lidocaine  (LIDODERM ) 5 % Place 1 patch onto the skin daily. Remove & Discard patch within 12 hours or as directed by MD     magnesium  oxide (MAG-OX) 400 (240 Mg) MG tablet Take 400 mg by mouth daily.     methocarbamol  (ROBAXIN ) 500 MG tablet Take 500 mg by mouth 4 (four) times daily as needed for muscle spasms.     metoprolol  tartrate (LOPRESSOR ) 25 MG tablet Take 12.5 mg by mouth 2 (two) times daily.     Multiple Vitamin (QUINTABS) TABS  Take 1 tablet by mouth.     naloxone  (NARCAN ) nasal spray 4 mg/0.1 mL Administer a single spray into one nostril, call 911. May repeat in 2 to 3 minutes with a new spray in the other nostril..     ondansetron  (ZOFRAN -ODT) 4 MG disintegrating tablet Take 4 mg by mouth every 4 (four) hours as needed for nausea or vomiting.     oxyCODONE  (OXY IR/ROXICODONE ) 5 MG immediate release tablet Take 5 mg by mouth every 6 (six) hours as needed for severe pain.     potassium chloride  SA (KLOR-CON  M) 20 MEQ tablet Take 20 mEq by mouth once.     sertraline  (ZOLOFT ) 25 MG tablet Take 1 tablet (25 mg total) by mouth daily. Hold until you see your doctor     sulfamethoxazole -trimethoprim  (BACTRIM  DS) 800-160 MG tablet Take 1 tablet by mouth 3 (three) times a week. (Monday, Wednesday, Friday)     thiamine  (VITAMIN B1) 100 MG tablet Take 100 mg by mouth daily.     traZODone  (DESYREL ) 150 MG tablet Take 75 mg by mouth at bedtime.     Oxycodone  HCl 10 MG  TABS Take 10 mg by mouth every 6 (six) hours as needed (pain).     pantoprazole  (PROTONIX ) 40 MG tablet Take 40 mg by mouth 2 (two) times daily. (Patient not taking: Reported on 10/09/2023)     venlafaxine  XR (EFFEXOR -XR) 37.5 MG 24 hr capsule Take 37.5 mg by mouth daily with breakfast.     No Known Allergies  family history is not on file.  History reviewed. No pertinent surgical history.        Objective Findings:  Vitals:   10/09/23 1630 10/09/23 1700 10/09/23 1705 10/09/23 1730  BP: (!) 131/96 (!) 132/95  (!) 128/97  Pulse: 70 71 90 89  Resp: (!) 23 (!) 26 20 (!) 22  Temp:      SpO2: 97% 98% 99% 99%  Weight:      Height:       No intake or output data in the 24 hours ending 10/09/23 1813 Filed Weights   10/09/23 1353  Weight: 51.3 kg    Examination:  Physical Exam Constitutional:      General: He is not in acute distress.    Appearance: He is ill-appearing.  HENT:     Mouth/Throat:     Mouth: Mucous membranes are dry.  Cardiovascular:      Rate and Rhythm: Regular rhythm. Tachycardia present.  Pulmonary:     Effort: No respiratory distress.     Breath sounds: Decreased air movement present. Wheezing present.  Musculoskeletal:        General: Normal range of motion.     Right lower leg: No edema.     Left lower leg: No edema.  Skin:    General: Skin is warm and dry.  Neurological:     General: No focal deficit present.     Mental Status: He is alert and oriented to person, place, and time.  Psychiatric:        Mood and Affect: Mood normal.        Behavior: Behavior normal.          Scheduled Medications:   [START ON 10/10/2023] bictegravir-emtricitabine -tenofovir  AF  1 tablet Oral Daily   busPIRone   5 mg Oral TID   [START ON 10/10/2023] cholecalciferol   2,000 Units Oral Daily   dronabinol   5 mg Oral BID WC   [START ON 10/10/2023] famotidine   10 mg Oral BID   [START ON 10/10/2023] folic acid   1 mg Oral Daily   gabapentin   600 mg Oral TID   heparin   5,000 Units Subcutaneous Q8H   ipratropium-albuterol   3 mL Nebulization QID   lidocaine   1-2 patch Transdermal Q24H   [START ON 10/10/2023] magnesium  oxide  400 mg Oral Daily   [START ON 10/10/2023] metoprolol  tartrate  12.5 mg Oral BID   [START ON 10/10/2023] multivitamin with minerals  1 tablet Oral Daily   potassium chloride  SA  20 mEq Oral Once   [START ON 10/10/2023] sertraline   25 mg Oral Daily   [START ON 10/10/2023] sulfamethoxazole -trimethoprim   1 tablet Oral Once per day on Monday Wednesday Friday   [START ON 10/10/2023] thiamine   100 mg Oral Daily   traZODone   75 mg Oral QHS   [START ON 10/10/2023] venlafaxine  XR  37.5 mg Oral Q breakfast    Continuous Infusions:  dextrose  5% lactated ringers  75 mL/hr at 10/09/23 1729    PRN Medications:  calcium  carbonate, diphenhydrAMINE , HYDROmorphone  (DILAUDID ) injection, ibuprofen , methocarbamol , ondansetron  **OR** ondansetron  (ZOFRAN ) IV  Antimicrobials:  Anti-infectives (From admission, onward)  Start     Dose/Rate  Route Frequency Ordered Stop   10/10/23 1000  bictegravir-emtricitabine -tenofovir  AF (BIKTARVY ) 50-200-25 MG per tablet 1 tablet        1 tablet Oral Daily 10/09/23 1708     10/10/23 0900  sulfamethoxazole -trimethoprim  (BACTRIM  DS) 800-160 MG per tablet 1 tablet       Note to Pharmacy: (Monday, Wednesday, Friday)     1 tablet Oral Once per day on Monday Wednesday Friday 10/09/23 1708             Data Reviewed: I have personally reviewed following labs and imaging studies  CBC: Recent Labs  Lab 10/09/23 1347  WBC 5.0  NEUTROABS 2.9  HGB 10.4*  HCT 33.0*  MCV 96.8  PLT 79*   Basic Metabolic Panel: Recent Labs  Lab 10/09/23 1347  NA 137  K 3.4*  CL 98  CO2 28  GLUCOSE 62*  BUN 11  CREATININE 0.46*  CALCIUM  6.9*   GFR: Estimated Creatinine Clearance: 94.4 mL/min (A) (by C-G formula based on SCr of 0.46 mg/dL (L)). Liver Function Tests: Recent Labs  Lab 10/09/23 1347  AST 141*  ALT 23  ALKPHOS 495*  BILITOT 1.8*  PROT 8.5*  ALBUMIN 1.9*   No results for input(s): LIPASE, AMYLASE in the last 168 hours. No results for input(s): AMMONIA in the last 168 hours. Coagulation Profile: No results for input(s): INR, PROTIME in the last 168 hours. Cardiac Enzymes: No results for input(s): CKTOTAL, CKMB, CKMBINDEX, TROPONINI in the last 168 hours. BNP (last 3 results) No results for input(s): PROBNP in the last 8760 hours. HbA1C: No results for input(s): HGBA1C in the last 72 hours. CBG: Recent Labs  Lab 10/09/23 1342 10/09/23 1414 10/09/23 1526 10/09/23 1721  GLUCAP 49* 87 78 77   Lipid Profile: No results for input(s): CHOL, HDL, LDLCALC, TRIG, CHOLHDL, LDLDIRECT in the last 72 hours. Thyroid Function Tests: No results for input(s): TSH, T4TOTAL, FREET4, T3FREE, THYROIDAB in the last 72 hours. Anemia Panel: No results for input(s): VITAMINB12, FOLATE, FERRITIN, TIBC, IRON, RETICCTPCT in the last 72  hours. Most Recent Urinalysis On File:     Component Value Date/Time   COLORURINE AMBER (A) 10/09/2023 1450   APPEARANCEUR HAZY (A) 10/09/2023 1450   APPEARANCEUR Clear 07/13/2013 0923   LABSPEC 1.025 10/09/2023 1450   LABSPEC 1.015 07/13/2013 0923   PHURINE 6.0 10/09/2023 1450   GLUCOSEU NEGATIVE 10/09/2023 1450   GLUCOSEU Negative 07/13/2013 0923   HGBUR SMALL (A) 10/09/2023 1450   BILIRUBINUR NEGATIVE 10/09/2023 1450   BILIRUBINUR Negative 07/13/2013 0923   KETONESUR 20 (A) 10/09/2023 1450   PROTEINUR >=300 (A) 10/09/2023 1450   NITRITE NEGATIVE 10/09/2023 1450   LEUKOCYTESUR NEGATIVE 10/09/2023 1450   LEUKOCYTESUR Negative 07/13/2013 0923   Sepsis Labs: @LABRCNTIP (procalcitonin:4,lacticidven:4)  No results found for this or any previous visit (from the past 240 hours).       Radiology Studies: US  Renal Result Date: 10/09/2023 CLINICAL DATA:  Hematuria. EXAM: RENAL / URINARY TRACT ULTRASOUND COMPLETE COMPARISON:  September 08, 2023. FINDINGS: Right Kidney: Renal measurements: 10.7 x 4.7 x 5.2 cm = volume: 138 mL. Mildly increased echogenicity of renal parenchyma is noted suggesting medical renal disease. No mass or hydronephrosis visualized. Left Kidney: Renal measurements: 10.1 x 5.6 x 5.2 cm = volume: 155 mL. Mildly increased echogenicity of renal parenchyma is noted suggesting medical renal disease. No mass or hydronephrosis visualized. Bladder: Mildly thickened bladder wall is noted most likely due to lack of distension,  but cystitis cannot be excluded. Other: None. IMPRESSION: 1. Mildly increased echogenicity of renal parenchyma is noted bilaterally suggesting medical renal disease. No hydronephrosis or renal obstruction is noted. 2. Mildly thickened urinary bladder wall is noted most likely due to lack of distension, but cystitis cannot be excluded. Electronically Signed   By: Lynwood Landy Raddle M.D.   On: 10/09/2023 16:09   DG Chest Portable 1 View Result Date: 10/09/2023 CLINICAL  DATA:  Shortness of breath EXAM: PORTABLE CHEST 1 VIEW COMPARISON:  September 22, 2023 FINDINGS: Slightly increased patchy opacities at the right lung base and left mid lung compared to prior. Additional chronic interstitial opacities bilaterally. Small bilateral pleural effusions. No pneumothorax. Unchanged cardiomediastinal silhouette. No acute osseous findings. IMPRESSION: Chronic heterogeneous opacities bilaterally with slightly increased patchy opacities in the right lower lung and left mid lung, may represent acute infection/inflammation. Unchanged small bilateral pleural effusions. Electronically Signed   By: Michaeline Blanch M.D.   On: 10/09/2023 14:18             LOS: 0 days        Stamatia Masri, DO [facility] 10/09/2023, 6:13 PM    Dictation software may have been used to generate the above note. Typos may occur and escape review in typed/dictated notes. Please contact Dr Marsa directly for clarity if needed.  Staff may message me via secure chat in Epic  but this may not receive an immediate response,  please page me for urgent matters!  If 7PM-7AM, please contact night coverage [email or web address]

## 2023-10-09 NOTE — ED Notes (Signed)
 Patient transported to CT

## 2023-10-10 ENCOUNTER — Other Ambulatory Visit: Payer: Self-pay

## 2023-10-10 DIAGNOSIS — B20 Human immunodeficiency virus [HIV] disease: Secondary | ICD-10-CM

## 2023-10-10 DIAGNOSIS — R52 Pain, unspecified: Secondary | ICD-10-CM | POA: Diagnosis not present

## 2023-10-10 DIAGNOSIS — D696 Thrombocytopenia, unspecified: Secondary | ICD-10-CM

## 2023-10-10 DIAGNOSIS — E43 Unspecified severe protein-calorie malnutrition: Secondary | ICD-10-CM

## 2023-10-10 DIAGNOSIS — E162 Hypoglycemia, unspecified: Secondary | ICD-10-CM

## 2023-10-10 DIAGNOSIS — Z515 Encounter for palliative care: Secondary | ICD-10-CM

## 2023-10-10 DIAGNOSIS — C469 Kaposi's sarcoma, unspecified: Secondary | ICD-10-CM | POA: Diagnosis not present

## 2023-10-10 LAB — CBC
HCT: 34.7 % — ABNORMAL LOW (ref 39.0–52.0)
Hemoglobin: 10.7 g/dL — ABNORMAL LOW (ref 13.0–17.0)
MCH: 30.4 pg (ref 26.0–34.0)
MCHC: 30.8 g/dL (ref 30.0–36.0)
MCV: 98.6 fL (ref 80.0–100.0)
Platelets: 67 K/uL — ABNORMAL LOW (ref 150–400)
RBC: 3.52 MIL/uL — ABNORMAL LOW (ref 4.22–5.81)
RDW: 16.4 % — ABNORMAL HIGH (ref 11.5–15.5)
WBC: 3.5 K/uL — ABNORMAL LOW (ref 4.0–10.5)
nRBC: 6.5 % — ABNORMAL HIGH (ref 0.0–0.2)

## 2023-10-10 LAB — COMPREHENSIVE METABOLIC PANEL WITH GFR
ALT: 18 U/L (ref 0–44)
AST: 64 U/L — ABNORMAL HIGH (ref 15–41)
Albumin: 1.8 g/dL — ABNORMAL LOW (ref 3.5–5.0)
Alkaline Phosphatase: 433 U/L — ABNORMAL HIGH (ref 38–126)
Anion gap: 4 — ABNORMAL LOW (ref 5–15)
BUN: 8 mg/dL (ref 6–20)
CO2: 34 mmol/L — ABNORMAL HIGH (ref 22–32)
Calcium: 6.8 mg/dL — ABNORMAL LOW (ref 8.9–10.3)
Chloride: 98 mmol/L (ref 98–111)
Creatinine, Ser: 0.45 mg/dL — ABNORMAL LOW (ref 0.61–1.24)
GFR, Estimated: >60 mL/min (ref >60)
Glucose, Bld: 114 mg/dL — ABNORMAL HIGH (ref 70–99)
Potassium: 3.4 mmol/L — ABNORMAL LOW (ref 3.5–5.1)
Sodium: 136 mmol/L (ref 135–145)
Total Bilirubin: 1.1 mg/dL (ref 0.0–1.2)
Total Protein: 8.2 g/dL — ABNORMAL HIGH (ref 6.5–8.1)

## 2023-10-10 LAB — GLUCOSE, CAPILLARY: Glucose-Capillary: 122 mg/dL — ABNORMAL HIGH (ref 70–99)

## 2023-10-10 MED ORDER — NALOXONE HCL 0.4 MG/ML IJ SOLN
INTRAMUSCULAR | Status: AC
  Filled 2023-10-10: qty 1

## 2023-10-10 MED ORDER — ENSURE PLUS HIGH PROTEIN PO LIQD
237.0000 mL | Freq: Two times a day (BID) | ORAL | Status: DC
  Administered 2023-10-10 – 2023-10-11 (×4): 237 mL via ORAL

## 2023-10-10 MED ORDER — DEXTROSE IN LACTATED RINGERS 5 % IV SOLN: INTRAVENOUS | Status: DC

## 2023-10-10 NOTE — Progress Notes (Signed)
   10/10/23 1415  Spiritual Encounters  Type of Visit Initial  Care provided to: Pt and family  Conversation partners present during Programmer, systems;Other (comment) Training and development officer)  Referral source Patient request  Reason for visit Routine spiritual support  OnCall Visit No   Chaplain assisted Unit Chaplain by visiting this patient in response to a [place] Consult entered in the system for prayer.  Chaplain introduced herself and [employer] and found patient's sister sitting with him.  Chaplain invited patient to share what prayer needs he had and he asked for the Lord to heal his body.  Chaplain lifted this in prayer and asked if there were any other needs and the patient said there were none.    Rev. Rana M. Nicholaus, M.Div. Chaplain Resident [facility]

## 2023-10-10 NOTE — Progress Notes (Signed)
 PROGRESS NOTE    Warren Farrell   FMW:[id number] DOB: [birth year]  DOA: 10/09/2023 Date of Service: 10/10/23 which is hospital day 1  PCP: Pcp, No    Hospital course / significant events:   HPI: Warren Farrell is a 33 y.o. 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 Problems with insurance, has not been able to follow w/ oncology / ID. Today 10/09/23 presents to ED [facility] from home for hypoglycemia, chest pain, back pain, hematuria, emesis. Cbg 52, received tube of glucose w/ EMS, improvement to 56.    09/04: to ED. VSS except mild tachypnea to max 26, 100% SpO2 on his baseline 2L [place], Glc 62, hypocalcemia 6.9, ALP elevated 495, hypoalbuminemia 1.9, mild transaminitis AST 141, GFR >60, Hgb 10.4 about baseline, thrombocytopenia Plt 79, UA (+)proteinuria >300, 0-5 RBC/hpf. Troponin neg/flat. Chronic heterogeneous opacities bilaterally with slightly increased patchy opacities in the right lower lung and left mid lung, may represent acute infection/inflammation. Renal US  concerning for medical renal disease. Hospitalist asked to admit. I spoke w/ Dr Babara oncology to confirm that cancer tx would be outpatient anyway so no need for ED to ED transfer to [facility]. Admission for pain control, renal w/u, eval for PE/PNA w/ chest CT, eval abd pain / gross metastasis w/ CT abd/pelv. Pt requested palliative care discussion, full code at this time  09/05: palliative and oncology consults pending recs   Consultants:  Palliative care  Medical oncology   Procedures/Surgeries: None       ASSESSMENT & PLAN:   Kaposi sarcoma w/ associated metastatic disease including lungs, spine, sternum which is causing chest/back pain  Opiate pain control - anticipate transition to po pain control over the  weekend Palliative care consult - appreciate recs re: rx management and if pursuing hospice care vs aggressive treatment Oncology consult - appreciate recs re: prognosis to help guide decision making whether to pursue aggressive treatment or not, especially w/ history of poor follow-up / barriers to treatment.   Question PCP pneumonia CT chest concerning for progressive ground-glass opacity Bactrim  PCP dose tid Expectorated sputum for PCP PCR    Chronic hypoxic respiratory failure Continue O2   Hypoglycemia Poor po intake  D5LR fluids Monitor Glc    Hypocalcemia  Baseline Monitor    ALP elevated 495 --> trend down 433 mild transaminitis AST 141 --> 64 Liver involvement / met disease Suspect increased w/ inflammatory condition cancer / bone involvement and can monitor    hypoalbuminemia 1.9 Severe malnutrition  Would supplement albumin if severe hypotension   Chronic normocytic anemia Hgb 10.4 about baseline Monitor CBC Hematology/oncology to follow   thrombocytopenia Plt 83 --> 67 Hold anticoag/DVT Ppx unless (+)PE Hematology/oncology to follow   UA (+)proteinuria >300 Pt reports gross hematuria however on UA here 0-5 RBC/hpf.  Renal US  c/w medical renal disease  Repeat UA/micro in AM If persistently abnormal consult nephrology    underweight based on BMI: Body mass index is 15.33 kg/m.SABRA Significantly low or high BMI is associated with higher medical risk.  Underweight - under 18  overweight - 25 to 29 obese - 30 or more Class 1 obesity: BMI of 30.0 to 34 Class 2 obesity: BMI of 35.0 to 39 Class 3 obesity: BMI of 40.0 to 49 Super Morbid Obesity: BMI 50-59 Super-super Morbid Obesity:  BMI 60+ Healthy nutrition and physical activity advised as adjunct to other disease management and risk reduction treatments    DVT prophylaxis: SCD IV fluids: D5LR continuous IV fluids  Nutrition: regular diet as tolerated  Central lines / other devices: none  Code  Status: FULL CODE ACP documentation reviewed:  none on file in [facility]  TOC needs: home health Medical barriers to dispo: pain control, onc consult. Expected medical readiness for discharge couple days as wean off IV pain meds.              Subjective / Brief ROS:  Patient reports still having bad pain today Denies CP/SOB at rest Denies new weakness.  Tolerating diet but not really eating.  Reports no concerns w/ urination/defecation.   Family Communication: family at bedside on rounds     Objective Findings:  Vitals:   10/10/23 0708 10/10/23 0812 10/10/23 1151 10/10/23 1624  BP:  118/88 103/68 101/65  Pulse:  (!) 119 85 90  Resp:  20  14  Temp:  (!) 97.5 F (36.4 C) 97.7 F (36.5 C) (!) 97.5 F (36.4 C)  TempSrc:      SpO2: 98% 100% 98% 100%  Weight:      Height:        Intake/Output Summary (Last 24 hours) at 10/10/2023 1657 Last data filed at 10/10/2023 1300 Gross per 24 hour  Intake 1729.05 ml  Output 200 ml  Net 1529.05 ml   Filed Weights   10/09/23 1353  Weight: 51.3 kg    Examination:  Physical Exam Constitutional:      General: He is not in acute distress.    Appearance: He is ill-appearing.  Cardiovascular:     Rate and Rhythm: Regular rhythm. Tachycardia present.  Pulmonary:     Effort: Pulmonary effort is normal.     Breath sounds: Normal breath sounds.  Neurological:     Mental Status: He is alert. Mental status is at baseline.  Psychiatric:        Mood and Affect: Mood normal.        Behavior: Behavior normal.          Scheduled Medications:   bictegravir-emtricitabine -tenofovir  AF  1 tablet Oral Daily   busPIRone   5 mg Oral TID   cholecalciferol   2,000 Units Oral Daily   dronabinol   5 mg Oral BID WC   famotidine   10 mg Oral BID   feeding supplement  237 mL Oral BID BM   folic acid   1 mg Oral Daily   gabapentin   600 mg Oral TID   lidocaine   1-2 patch Transdermal Q24H   magnesium  oxide  400 mg Oral Daily   metoprolol   tartrate  12.5 mg Oral BID   multivitamin with minerals  1 tablet Oral Daily   sertraline   25 mg Oral Daily   sulfamethoxazole -trimethoprim   2 tablet Oral Q8H   thiamine   100 mg Oral Daily   traZODone   75 mg Oral QHS   venlafaxine  XR  37.5 mg Oral Q breakfast    Continuous Infusions:  dextrose  5% lactated ringers       PRN Medications:  calcium  carbonate, diphenhydrAMINE , HYDROmorphone  (DILAUDID ) injection, ibuprofen , methocarbamol , ondansetron  **OR** ondansetron  (ZOFRAN ) IV  Antimicrobials from admission:  Anti-infectives (From admission, onward)    Start     Dose/Rate Route Frequency Ordered Stop   10/10/23 1000  bictegravir-emtricitabine -tenofovir  AF (BIKTARVY ) 50-200-25 MG per tablet 1 tablet        1 tablet Oral Daily 10/09/23 1708  10/10/23 0900  sulfamethoxazole -trimethoprim  (BACTRIM  DS) 800-160 MG per tablet 1 tablet  Status:  Discontinued       Note to Pharmacy: (Monday, Wednesday, Friday)     1 tablet Oral Once per day on Monday Wednesday Friday 10/09/23 1708 10/09/23 1841   10/09/23 2200  sulfamethoxazole -trimethoprim  (BACTRIM  DS) 800-160 MG per tablet 2 tablet       Note to Pharmacy: (Monday, Wednesday, Friday)     2 tablet Oral Every 8 hours 10/09/23 1841             Data Reviewed:  I have personally reviewed the following...  CBC: Recent Labs  Lab 10/09/23 1347 10/09/23 1939 10/10/23 0417  WBC 5.0 4.6 3.5*  NEUTROABS 2.9  --   --   HGB 10.4* 10.4* 10.7*  HCT 33.0* 33.3* 34.7*  MCV 96.8 98.5 98.6  PLT 79* 83* 67*   Basic Metabolic Panel: Recent Labs  Lab 10/09/23 1347 10/09/23 1939 10/10/23 0417  NA 137  --  136  K 3.4*  --  3.4*  CL 98  --  98  CO2 28  --  34*  GLUCOSE 62*  --  114*  BUN 11  --  8  CREATININE 0.46* 0.56* 0.45*  CALCIUM  6.9*  --  6.8*   GFR: Estimated Creatinine Clearance: 94.4 mL/min (A) (by C-G formula based on SCr of 0.45 mg/dL (L)). Liver Function Tests: Recent Labs  Lab 10/09/23 1347 10/10/23 0417  AST 141*  64*  ALT 23 18  ALKPHOS 495* 433*  BILITOT 1.8* 1.1  PROT 8.5* 8.2*  ALBUMIN 1.9* 1.8*   No results for input(s): LIPASE, AMYLASE in the last 168 hours. No results for input(s): AMMONIA in the last 168 hours. Coagulation Profile: No results for input(s): INR, PROTIME in the last 168 hours. Cardiac Enzymes: No results for input(s): CKTOTAL, CKMB, CKMBINDEX, TROPONINI in the last 168 hours. BNP (last 3 results) No results for input(s): PROBNP in the last 8760 hours. HbA1C: No results for input(s): HGBA1C in the last 72 hours. CBG: Recent Labs  Lab 10/09/23 1342 10/09/23 1414 10/09/23 1526 10/09/23 1721 10/10/23 0149  GLUCAP 49* 87 78 77 122*   Lipid Profile: No results for input(s): CHOL, HDL, LDLCALC, TRIG, CHOLHDL, LDLDIRECT in the last 72 hours. Thyroid Function Tests: No results for input(s): TSH, T4TOTAL, FREET4, T3FREE, THYROIDAB in the last 72 hours. Anemia Panel: No results for input(s): VITAMINB12, FOLATE, FERRITIN, TIBC, IRON, RETICCTPCT in the last 72 hours. Most Recent Urinalysis On File:     Component Value Date/Time   COLORURINE AMBER (A) 10/09/2023 1450   APPEARANCEUR HAZY (A) 10/09/2023 1450   APPEARANCEUR Clear 07/13/2013 0923   LABSPEC 1.025 10/09/2023 1450   LABSPEC 1.015 07/13/2013 0923   PHURINE 6.0 10/09/2023 1450   GLUCOSEU NEGATIVE 10/09/2023 1450   GLUCOSEU Negative 07/13/2013 0923   HGBUR SMALL (A) 10/09/2023 1450   BILIRUBINUR NEGATIVE 10/09/2023 1450   BILIRUBINUR Negative 07/13/2013 0923   KETONESUR 20 (A) 10/09/2023 1450   PROTEINUR >=300 (A) 10/09/2023 1450   NITRITE NEGATIVE 10/09/2023 1450   LEUKOCYTESUR NEGATIVE 10/09/2023 1450   LEUKOCYTESUR Negative 07/13/2013 0923   Sepsis Labs: @LABRCNTIP (procalcitonin:4,lacticidven:4) Microbiology: No results found for this or any previous visit (from the past 240 hours).    Radiology Studies last 3 days: CT ABDOMEN PELVIS WO  CONTRAST Result Date: 10/09/2023 CLINICAL DATA:  Abdominal pain, acute, nonlocalized hematuria, evaluate for stone or other cause, renal US  nonrevealing EXAM: CT ABDOMEN AND PELVIS WITHOUT CONTRAST  TECHNIQUE: Multidetector CT imaging of the abdomen and pelvis was performed following the standard protocol without IV contrast. RADIATION DOSE REDUCTION: This exam was performed according to the departmental dose-optimization program which includes automated exposure control, adjustment of the mA and/or kV according to patient size and/or use of iterative reconstruction technique. COMPARISON:  No ultrasound earlier today. Contrast-enhanced abdominopelvic CT 09/08/2023 FINDINGS: Lower chest: Assessed on chest CTA performed can concurrently, reported separately. Hepatobiliary: Stable hypodense liver lesions, largest measuring 2.6 cm. Direct comparison is limited in the absence of IV contrast. Gallbladder physiologically distended, no calcified stone. No biliary dilatation. Pancreas: Not well assessed in the absence of contrast and paucity of intra-abdominal fat. No obvious inflammation. Spleen: Calcified splenic granulomas. The splenic lesions on prior are not demonstrated in the absence of contrast. No splenomegaly. Adrenals/Urinary Tract: No adrenal nodule. No hydronephrosis or renal calculi. No evidence of renal mass on this unenhanced exam. Partially distended urinary bladder, improved bladder wall thickening from prior. Stomach/Bowel: Suboptimally assessed in the absence of contrast and paucity of intra-abdominal fat. Suggestion of gastric wall thickening. No small bowel distension or evidence of obstruction. Minimal formed stool in the colon. A few colonic diverticula in the descending and sigmoid. Vascular/Lymphatic: Normal caliber abdominal aorta. No evidence of portal venous or mesenteric gas. Adenopathy assessment is limited in the absence of contrast and paucity of body fat. The soft tissue thickening within the  upper retroperitoneum on prior is suboptimally assessed on the current exam. Reproductive: Prostate is unremarkable. Other: Small amount of free fluid in the pelvis, improved from prior. Mild generalized body wall edema. Infiltrative changes in the subcutaneous tissues of the upper thigh 6, without significant change. Musculoskeletal: Innumerable lucent lesions throughout the skeleton. Small lucent lesion in the right femoral neck. No obvious extraosseous soft tissue component. IMPRESSION: 1. No renal stones or obstructive uropathy. 2. Suggestion of gastric wall thickening, can be seen with gastritis. 3. Innumerable lucent lesions throughout the skeleton, unchanged from prior. 4. Grossly stable hypodense liver lesions, suboptimally assessed in the absence of IV contrast. 5. Small amount of free fluid in the pelvis, improved from prior. 6. Mild generalized body wall edema. Infiltrative changes in the subcutaneous tissues of the upper thigh, without significant change. Electronically Signed   By: Andrea Gasman M.D.   On: 10/09/2023 18:55   CT Angio Chest Pulmonary Embolism (PE) W or WO Contrast Result Date: 10/09/2023 CLINICAL DATA:  Provided history: Cpugh and SOB, known lung complications of kaposi sarcoma, checking for PE EXAM: CT ANGIOGRAPHY CHEST WITH CONTRAST TECHNIQUE: Multidetector CT imaging of the chest was performed using the standard protocol during bolus administration of intravenous contrast. Multiplanar CT image reconstructions and MIPs were obtained to evaluate the vascular anatomy. RADIATION DOSE REDUCTION: This exam was performed according to the departmental dose-optimization program which includes automated exposure control, adjustment of the mA and/or kV according to patient size and/or use of iterative reconstruction technique. CONTRAST:  75mL OMNIPAQUE  IOHEXOL  350 MG/ML SOLN COMPARISON:  Radiograph earlier today. Most recent chest CTA 09/22/2023, this is the patient's third chest CTA in the  last month. FINDINGS: Cardiovascular: There are no filling defects within the pulmonary arteries to suggest pulmonary embolus. Dilated main pulmonary artery at 3.8 cm. The heart is mildly enlarged, chronic. No aortic dissection or acute aortic findings. No pericardial effusion. Mediastinum/Nodes: Again seen subcarinal node/mass on the right, measuring 3.6 cm AP, previously 3.2 cm using same measurement technique. This causes mass effect on the left atrium. Increasing soft tissue thickening  in the anterior mediastinum and prevascular space. No obvious esophageal wall thickening. Lungs/Pleura: Again seen chronic lung disease. There are small bilateral pleural effusions and thickening that is stable from prior exam. Chronic scarring and bronchiectasis in the upper lungs with subpleural cystic changes, stable. Widespread bilateral peribronchovascular ill-defined nodules and nodularity again seen. Progressive areas of ground-glass in the lower lobes from prior trachea and central airways are clear. Upper Abdomen: Assessed on concurrent abdominopelvic CT, reported separately. Musculoskeletal: Again seen innumerable lytic lesions throughout the skeleton. There is no obvious extra-axial component or encroachment on the spinal canal. Generalized paucity of body fat may represent cachexia. Review of the MIP images confirms the above findings. IMPRESSION: 1. No pulmonary embolus. 2. Bilateral pleural effusions and thickening, stable from prior exam. 3. Chronic scarring and bronchiectasis with upper lobe predominant subpleural cystic change, stable widespread bilateral peribronchovascular ill-defined nodules and nodularity again seen. 4. Progressive areas of ground-glass in the lower lobes from prior exam, may be infectious or inflammatory. 5. Increasing subcarinal node or mass causing mass effect on the left atrium. Increasing soft tissue thickening in the anterior mediastinum and prevascular space, may be neoplastic or  inflammatory. 6. Again seen innumerable lytic lesions throughout the skeleton. 7. Dilated main pulmonary artery at 3.8 cm, can be seen with pulmonary arterial hypertension. Electronically Signed   By: Andrea Gasman M.D.   On: 10/09/2023 18:48   US  Renal Result Date: 10/09/2023 CLINICAL DATA:  Hematuria. EXAM: RENAL / URINARY TRACT ULTRASOUND COMPLETE COMPARISON:  September 08, 2023. FINDINGS: Right Kidney: Renal measurements: 10.7 x 4.7 x 5.2 cm = volume: 138 mL. Mildly increased echogenicity of renal parenchyma is noted suggesting medical renal disease. No mass or hydronephrosis visualized. Left Kidney: Renal measurements: 10.1 x 5.6 x 5.2 cm = volume: 155 mL. Mildly increased echogenicity of renal parenchyma is noted suggesting medical renal disease. No mass or hydronephrosis visualized. Bladder: Mildly thickened bladder wall is noted most likely due to lack of distension, but cystitis cannot be excluded. Other: None. IMPRESSION: 1. Mildly increased echogenicity of renal parenchyma is noted bilaterally suggesting medical renal disease. No hydronephrosis or renal obstruction is noted. 2. Mildly thickened urinary bladder wall is noted most likely due to lack of distension, but cystitis cannot be excluded. Electronically Signed   By: Lynwood Landy Raddle M.D.   On: 10/09/2023 16:09   DG Chest Portable 1 View Result Date: 10/09/2023 CLINICAL DATA:  Shortness of breath EXAM: PORTABLE CHEST 1 VIEW COMPARISON:  September 22, 2023 FINDINGS: Slightly increased patchy opacities at the right lung base and left mid lung compared to prior. Additional chronic interstitial opacities bilaterally. Small bilateral pleural effusions. No pneumothorax. Unchanged cardiomediastinal silhouette. No acute osseous findings. IMPRESSION: Chronic heterogeneous opacities bilaterally with slightly increased patchy opacities in the right lower lung and left mid lung, may represent acute infection/inflammation. Unchanged small bilateral pleural  effusions. Electronically Signed   By: Michaeline Blanch M.D.   On: 10/09/2023 14:18          Aidynn Polendo, DO [facility] 10/10/2023, 4:57 PM    Dictation software may have been used to generate the above note. Typos may occur and escape review in typed/dictated notes. Please contact Dr Marsa directly for clarity if needed.  Staff may message me via secure chat in Epic  but this may not receive an immediate response,  please page me for urgent matters!  If 7PM-7AM, please contact night coverage [email or web address]

## 2023-10-10 NOTE — TOC Initial Note (Signed)
 Transition of Care [facility]) - Initial/Assessment Note    Patient Details  Name: Warren Farrell MRN: [id number] Date of Birth: 1990-07-15  Transition of Care [facility]) CM/SW Contact:    Dalia GORMAN Fuse, RN Phone Number: 10/10/2023, 12:09 PM  Clinical Narrative:                      Housing resources added to the patient's AVS.Transition of Care Department [facility]) has reviewed patient and no TOC needs have been identified at this time. If new patient transition needs arise, please place a TOC consult.   Patient Goals and CMS Choice            Expected Discharge Plan and Services                                              Prior Living Arrangements/Services                       Activities of Daily Living   ADL Screening (condition at time of admission) Independently performs ADLs?: No (Patient has gotten weaker and states that in the 2 weeks to month he is so weak that not really able to walk or get out of bed.) Does the patient have a NEW difficulty with bathing/dressing/toileting/self-feeding that is expected to last >3 days?: Yes (Initiates electronic notice to provider for possible OT consult) Does the patient have a NEW difficulty with getting in/out of bed, walking, or climbing stairs that is expected to last >3 days?: Yes (Initiates electronic notice to provider for possible PT consult) Does the patient have a NEW difficulty with communication that is expected to last >3 days?: No Is the patient deaf or have difficulty hearing?: No Does the patient have difficulty seeing, even when wearing glasses/contacts?: No Does the patient have difficulty concentrating, remembering, or making decisions?: No  Permission Sought/Granted                  Emotional Assessment              Admission diagnosis:  Kaposi sarcoma (HCC) [C46.9] Kaposi's disease [Q82.1] Weakness [R53.1] Hypoglycemia [E16.2] Intractable pain [R52] Patient Active Problem List    Diagnosis Date Noted   Acute on chronic respiratory failure with hypoxia (HCC) 09/08/2023   AIDS (HCC) 09/08/2023   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 [place] [address] Phone: [phone] Fax: [phone]     Social Drivers of Health (SDOH) Social History: SDOH Screenings   Food Insecurity: No Food Insecurity (10/10/2023)  Housing: High Risk (07/14/2023)   [facility]  Transportation Needs: No Transportation Needs (07/23/2023)   Received from [facility]  Utilities: Not At Risk (07/14/2023)   [facility]  Financial Resource Strain: High Risk (07/14/2023)   Received from [facility]  Physical Activity: Inactive (07/14/2023)  Received from [facility]  Social Connections: Socially Isolated (07/14/2023)   Received from [facility]  Stress: No Stress Concern Present (07/14/2023)   Received from [facility]  Tobacco Use: Low Risk  (10/09/2023)   SDOH Interventions:     Readmission Risk Interventions     No data to display

## 2023-10-10 NOTE — Evaluation (Signed)
 Physical Therapy Evaluation Patient Details Name: Warren Farrell MRN: [id number] DOB: Jan 15, 1991 Today's Date: 10/10/2023  History of Present Illness  Pt is a 33 y.o. male presents to ED [facility] from home for hypoglycemia, chest pain, back pain, hematuria, emesis. 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 Problems with insurance, has not been able to follow w/ oncology / ID.   Clinical Impression  Patient received sleeping in recliner, sister present in room. He is pleasant and agrees to get back into bed. Patient requires min A for pivot from recliner to bed. He will continue to benefit from skilled PT to improve functional independence and strength.          If plan is discharge home, recommend the following: A little help with walking and/or transfers;A little help with bathing/dressing/bathroom   Can travel by private vehicle    yes    Equipment Recommendations None recommended by PT  Recommendations for Other Services       Functional Status Assessment Patient has had a recent decline in their functional status and/or demonstrates limited ability to make significant improvements in function in a reasonable and predictable amount of time     Precautions / Restrictions Precautions Precautions: Fall Recall of Precautions/Restrictions: Intact Restrictions Weight Bearing Restrictions Per Provider Order: No      Mobility  Bed Mobility Overal bed mobility: Modified Independent                  Transfers Overall transfer level: Needs assistance Equipment used: None Transfers: Bed to chair/wheelchair/BSC       Squat pivot transfers: Min assist          Ambulation/Gait               General Gait Details: not ambulatory since June  Stairs             Wheelchair Mobility     Tilt Bed    Modified Rankin (Stroke Patients Only)       Balance Overall balance assessment: Needs assistance Sitting-balance support: Feet supported Sitting balance-Leahy Scale: Normal                                       Pertinent Vitals/Pain Pain Assessment Pain Assessment: Faces Faces Pain Scale: Hurts little more Pain Location: all over Pain Descriptors / Indicators: Aching, Sore, Discomfort Pain Intervention(s): Monitored during session, Repositioned    Home Living Family/patient expects to be discharged to:: Private residence Living Arrangements: Other relatives Available Help at Discharge: Family;Available 24 hours/day   Home Access: Stairs to enter   Entrance Stairs-Number of Steps: 3 or 6 family bumps him up steps in W/C   Home Layout: One level Home Equipment: Wheelchair - Geophysical data processor      Prior Function Prior Level of Function : Independent/Modified Independent       Physical Assist : Mobility (physical)   ADLs (physical): Toileting;Dressing;IADLs Mobility Comments: Patient normally transfers independently. ADLs Comments: MOD I most of the time, some days where he needs assist     Extremity/Trunk Assessment   Upper Extremity Assessment Upper Extremity Assessment: Defer to OT evaluation    Lower Extremity Assessment Lower Extremity Assessment: Generalized weakness LLE Deficits / Details: appears  with slight knee flexion contracture LLE Coordination: decreased gross motor    Cervical / Trunk Assessment Cervical / Trunk Assessment: Normal  Communication   Communication Communication: No apparent difficulties    Cognition Arousal: Alert Behavior During Therapy: WFL for tasks assessed/performed   PT - Cognitive impairments: No apparent impairments                         Following commands: Intact       Cueing Cueing Techniques: Verbal cues     General  Comments      Exercises     Assessment/Plan    PT Assessment Patient needs continued PT services  PT Problem List Decreased strength;Decreased range of motion;Decreased balance;Decreased mobility;Decreased activity tolerance;Pain       PT Treatment Interventions DME instruction;Gait training;Stair training;Functional mobility training;Therapeutic exercise;Therapeutic activities;Balance training;Patient/family education    PT Goals (Current goals can be found in the Care Plan section)  Acute Rehab PT Goals Patient Stated Goal: be able to walk some again PT Goal Formulation: With patient Time For Goal Achievement: 10/24/23 Potential to Achieve Goals: Fair    Frequency Min 2X/week     Co-evaluation               AM-PAC PT 6 Clicks Mobility  Outcome Measure Help needed turning from your back to your side while in a flat bed without using bedrails?: A Little Help needed moving from lying on your back to sitting on the side of a flat bed without using bedrails?: A Little Help needed moving to and from a bed to a chair (including a wheelchair)?: A Little Help needed standing up from a chair using your arms (e.g., wheelchair or bedside chair)?: A Lot Help needed to walk in hospital room?: A Lot Help needed climbing 3-5 steps with a railing? : A Lot 6 Click Score: 15    End of Session Equipment Utilized During Treatment: Oxygen Activity Tolerance: Patient tolerated treatment well Patient left: in bed;with call bell/phone within reach;with bed alarm set;with family/visitor present Nurse Communication: Mobility status PT Visit Diagnosis: Other abnormalities of gait and mobility (R26.89);Muscle weakness (generalized) (M62.81);Difficulty in walking, not elsewhere classified (R26.2)    Time: 8564-8554 PT Time Calculation (min) (ACUTE ONLY): 10 min   Charges:   PT Evaluation $PT Eval Low Complexity: 1 Low   PT General Charges $$ ACUTE PT VISIT: 1 Visit          Brevan Luberto, PT, GCS 10/10/23,3:18 PM

## 2023-10-10 NOTE — Evaluation (Signed)
 Occupational Therapy Evaluation Patient Details Name: Warren Farrell MRN: [id number] DOB: 07/07/1990 Today's Date: 10/10/2023   History of Present Illness   Pt is a 33 y.o. male presents to ED [facility] from home for hypoglycemia, chest pain, back pain, hematuria, emesis. 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 Problems with insurance, has not been able to follow w/ oncology / ID.     Clinical Impressions Pt was seen for OT evaluation this date. PTA, he resides with his sister and sister's children. At baseline, pt is MOD I with squat pivot transfers to all surfaces. Family utilizes a transport chair to move him around in the home, take him outside, to the toilet, etc. Per sister, he has not been ambulatory since the few times walking with therapy April-June 2025. He is mostly independent with ADL performance, but sister reports they do assist at times. Family manages IADLs.  Pt presents with deficits in strength, balance and activity tolerance, affecting safe and optimal ADL completion. He has pain all over that is chronic. Pt currently requires MOD I for bed mobility and Min A for squat pivot transfer bed to recliner. Generalized weakness noted, HR up to 111 with activity. Pt able to donn bil socks via long sitting in bed with MOD I. Pt would benefit from skilled OT services to address noted impairments and functional limitations to maximize safety and independence while minimizing future risk of falls, injury, and readmission. Do anticipate the need for follow up OT services upon acute hospital DC.      If plan is discharge home, recommend the following:   A little help with walking and/or transfers;A little help with bathing/dressing/bathroom     Functional Status  Assessment   Patient has had a recent decline in their functional status and demonstrates the ability to make significant improvements in function in a reasonable and predictable amount of time.     Equipment Recommendations   None recommended by OT (has needed equipment)     Recommendations for Other Services         Precautions/Restrictions   Precautions Precautions: Fall Recall of Precautions/Restrictions: Intact Restrictions Weight Bearing Restrictions Per Provider Order: No     Mobility Bed Mobility Overal bed mobility: Modified Independent                  Transfers Overall transfer level: Needs assistance   Transfers: Bed to chair/wheelchair/BSC     Squat pivot transfers: Min assist              Balance Overall balance assessment: Needs assistance Sitting-balance support: Feet supported, Bilateral upper extremity supported Sitting balance-Leahy Scale: Good     Standing balance support: No upper extremity supported Standing balance-Leahy Scale: Poor Standing balance comment: Min A needed for squat pivot; does not fully stand upright this date                           ADL either performed or assessed with clinical judgement   ADL Overall ADL's : Needs assistance/impaired                     Lower Body Dressing: Supervision/safety;Bed level Lower Body Dressing Details (indicate cue type and reason): able to donn bil socks via long sitting  in bed Toilet Transfer: Minimal assistance;Squat-pivot Toilet Transfer Details (indicate cue type and reason): simulated via squat pivot to recliner                 Vision         Perception         Praxis         Pertinent Vitals/Pain Pain Assessment Pain Assessment: Faces Faces Pain Scale: Hurts even more Pain Location: all over Pain Descriptors / Indicators: Aching Pain Intervention(s): Monitored during session, Repositioned     Extremity/Trunk  Assessment Upper Extremity Assessment Upper Extremity Assessment: Generalized weakness   Lower Extremity Assessment Lower Extremity Assessment: Generalized weakness;LLE deficits/detail LLE Deficits / Details: appears with slight knee flexion contracture       Communication Communication Communication: No apparent difficulties   Cognition Arousal: Alert Behavior During Therapy: WFL for tasks assessed/performed                                 Following commands: Intact       Cueing  General Comments          Exercises Other Exercises Other Exercises: Edu on role of OT in acute setting and DC recommendation.   Shoulder Instructions      Home Living Family/patient expects to be discharged to:: Private residence Living Arrangements: Other relatives (sister and her children) Available Help at Discharge: Family;Available 24 hours/day   Home Access: Stairs to enter     Home Layout: One level               Home Equipment: Transport chair;Wheelchair - manual          Prior Functioning/Environment Prior Level of Function : Needs assist       Physical Assist : ADLs (physical)   ADLs (physical): Toileting;Dressing;IADLs Mobility Comments: squat pivot at baseline from bed<>transport chair, BSC, etc. sister reports they wheel him within the home to go outside, to the toilet, etc. will use [facility] when has urgent needs, intermittent ADL assist; family makes all his meals; he does not ambulate at home, but did a few times while in rehab April-June 2025 ADLs Comments: MOD I most of the time, some days where he needs assist    OT Problem List: Decreased strength;Decreased activity tolerance;Impaired balance (sitting and/or standing);Pain   OT Treatment/Interventions: Self-care/ADL training;Balance training;Therapeutic exercise;Therapeutic activities;DME and/or AE instruction;Patient/family education;Energy conservation      OT Goals(Current goals can be  found in the care plan section)   Acute Rehab OT Goals Patient Stated Goal: get better OT Goal Formulation: With patient Time For Goal Achievement: 10/24/23 Potential to Achieve Goals: Good ADL Goals Pt Will Perform Lower Body Bathing: with supervision;sitting/lateral leans;sit to/from stand Pt Will Perform Lower Body Dressing: with modified independence;sitting/lateral leans Pt Will Transfer to Toilet: with modified independence;squat pivot transfer   OT Frequency:  Min 2X/week    Co-evaluation              AM-PAC OT 6 Clicks Daily Activity     Outcome Measure Help from another person eating meals?: None Help from another person taking care of personal grooming?: A Little Help from another person toileting, which includes using toliet, bedpan, or urinal?: A Little Help from another person bathing (including washing, rinsing, drying)?: A Little Help from another person to put on and taking off regular upper body clothing?: A Little Help from another person to put  on and taking off regular lower body clothing?: A Little 6 Click Score: 19   End of Session Equipment Utilized During Treatment: Oxygen Nurse Communication: Mobility status  Activity Tolerance: Patient tolerated treatment well Patient left: in chair;with call bell/phone within reach;with family/visitor present  OT Visit Diagnosis: Other abnormalities of gait and mobility (R26.89);Muscle weakness (generalized) (M62.81)                Time: 8848-8778 OT Time Calculation (min): 30 min Charges:  OT General Charges $OT Visit: 1 Visit OT Evaluation $OT Eval Low Complexity: 1 Low OT Treatments $Self Care/Home Management : 8-22 mins Lea Walbert, OTR/L  10/10/23, 1:07 PM  Natayla Cadenhead E Amyre Segundo 10/10/2023, 1:01 PM

## 2023-10-10 NOTE — Discharge Instructions (Signed)
 Rent/Utility/Housing  Agency Name: [facility] Agency Address: [address] [place], [place] [address] Phone: [phone] Email: [email or web address] Website: [email or web address] Service(s) Offered: Housing services, self-sufficiency, congregate meal program, weatherization program, Field seismologist program, emergency food assistance,  housing counseling, home ownership program, wheels -towork program.  Agency Name: [facility] Address: [address] [place], [place] [address] Phone: [phone] (8a-4p) [phone] (8p- 10p) Email: [email or web address] Website: [email or web address] Service(s) Offered: A program for homeless and/or needy men that includes one-on-one counseling, life skills training and job rehabilitation.  Agency Name: [facility] Address: [address] [place], [place] [address] Phone: [phone] Website: www.[email or web address].org Service(s) Offered: Assistance to needy in emergency with utility bills, heating fuel, and prescriptions. Shelter for homeless 7pm-7am. May 30, 2016 15  Agency Name: [facility] [place] [facility] Address: [address], [place], [place] [address] Phone: [phone] Contact Person: Cathleen Corti Email: [email or web address] Website: [email or web address].[email or web address] Service(s) Offered: Helps individuals with developmental disabilities move from housing that is more restrictive to homes where they  can achieve greater independence and have more  opportunities.  Agency Name: [facility] Address: [address], [place], [place] [address] Phone: [phone] Email: [email or web address] Website: www.[email or web address].org Service(s) Offered: Provides affordable housing for low-income families, elderly, and disabled individuals. Offer a wide range of  programs and services, from financial planning to  afterschool and summer programs.  Agency Name: [facility] Address: [address] [place], [place] [address] Phone: [phone] Service(s) Offered: Child support services; child welfare services; food stamps; Medicaid; work first family assistance; and aid with fuel,  rent, food and medicine.  Agency Name: [facility] [place] [employer] Address: [facility] [address], [place], [place]  [address] Phone: [phone] Website: www.familyabuseservices.org Service(s) Offered: 24 hour Crisis Line: [phone]; 24 hour Emergency Shelter; Transitional Housing; Support Groups; Scientist, physiological; [employer]; Hispanic Outreach: [phone];  Visitation Center: [phone].  Agency Name: [facility] [place]. Address: [address] [place], [place] [address] Phone: [phone] Service(s) Offered: CAP Services; Home and [employer]; Individual or Group Supports; Respite Care Non-Institutional Nursing;  Residential Supports; Respite Care and Personal Care Services; Transportation; Family and Friends Night; Recreational Activities; Three Nutritious Meals/Snacks; Consultation with Registered Dietician; Twenty-four hour Registered Nurse Access; Daily and [employer]; [employer]aves; [place] for the [employer] (During Summer Months) Bingo Night (Every  Wednesday Night); Special Populations Dance Night  (Every Tuesday Night); Professional Hair Care Services.  Agency Name: [facility] Address: [address], [place], [place] [address] Phone: [phone] Contact Person: Jabier Mutton Website: [email or web address].Physicist, medical) Offered: Residential treatment facility for women; food and  clothing, educational & employment development and  transportation to work; Counsellor of financial skills;  parenting and family reunification; emotional and spiritual  support;  transitional housing for program graduates.  Agency Name: [facility] Address: [address] [place], [place] [address] Phone: [phone] Email: [email or web address] Website: [email or web address].es Service(s) Offered: Public housing units for elderly, disabled, and low income people; housing choice vouchers for income eligible  applicants; shelter plus care vouchers; and Psychologist, clinical.  Agency Name: [facility] [employer] Address: [address] [place], [place] [address] Phone: [phone] Email: [email or web address] Website: www.habitatalamance.org Service(s) Offered: Build houses for families in need of decent housing. Each adult in the family must invest 200 hours of labor on  someone else's house, work with volunteers to build their own house, attend classes  on budgeting, home maintenance, yard care, and attend homeowner association meetings.  Agency Name: [facility] Address: [address], [place], [place] [address] Phone: [phone] Website: www.rsli.org Service(s) Offered: Intermediate care facilities for intellectually delayed, Supervised Living in group homes for adults with developmental disabilities, Supervised Living for people who have dual diagnoses (MRMI), Independent Living, Supported Living, respite and a variety of CAP services, pre-vocational services, day supports, and [employer].  Agency Name: [place] [employer] Phone: [phone] Website: www.[place]CForeclosurePrevention.gov Service(s) Offered: Zero-interest, deferred loans to homeowners struggling to pay their mortgage. Call for more information.

## 2023-10-10 NOTE — Consult Note (Addendum)
 Hematology/Oncology Consult note [phone] [phone]      Patient Care Team: Pcp, No as PCP - General   Name of the patient: Warren Farrell  [id number]  [birth year]   REASON FOR COSULTATION:  Kaposi's sarcoma History of presenting illness-  33 y.o. male with past medical history of HIV/AIDS, Kaposi's sarcoma with visceral metastasis, PE, currently off anticoagulation, G6PD, PJP pneumonia, chronic respiratory failure, MRSA bacteremia presented emergency room for evaluation of chest pain, hematuria, emesis, hypoglycemia.  Patient is currently admitted for questionable PCP pneumonia, pain control 10/10/2023, CT chest angiogram showed  1. No pulmonary embolus. 2. Bilateral pleural effusions and thickening, stable from prior exam. 3. Chronic scarring and bronchiectasis with upper lobe predominant subpleural cystic change, stable widespread bilateral peribronchovascular ill-defined nodules and nodularity again seen. 4. Progressive areas of ground-glass in the lower lobes from prior exam, may be infectious or inflammatory. 5. Increasing subcarinal node or mass causing mass effect on the left atrium. Increasing soft tissue thickening in the anterior mediastinum and prevascular space, may be neoplastic or inflammatory. 6. Again seen innumerable lytic lesions throughout the skeleton. 7. Dilated main pulmonary artery at 3.8 cm, can be seen with pulmonary arterial hypertension.   CT abdomen pelvis without contrast showed 1. No renal stones or obstructive uropathy. 2. Suggestion of gastric wall thickening, can be seen with gastritis. 3. Innumerable lucent lesions throughout the skeleton, unchanged from prior. 4. Grossly stable hypodense liver lesions, suboptimally assessed in the absence of IV contrast. 5. Small amount of free fluid in the pelvis, improved from prior. 6. Mild generalized body wall edema. Infiltrative changes in the subcutaneous tissues of the  upper thigh, without significant change.   He has a long-standing history of Kaposi's sarcoma, diagnosed in 2017, initially treated with Doxil, subsequently treated with paclitaxel.  Chemotherapy was last administered in 2023 at [facility]. hepatic embolization of right anterior hepatic artery on 06/17/2023 Detail of treatment records were not available to me at the time of dictation. Relocation to [place] has hindered continued care at [facility]. Patient currently lives at home with her sister.  He has HIV and is compliant with his HIV medication regimen. Kaposi's sarcoma has resulted in skin lesions on his legs, chest wall, and possibly his back.  Appetite has been poor.   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 No pertinent surgical history.  Social History   Socioeconomic History   Marital status: Single    Spouse name: Not on file   Number of children: Not on file   Years of education: Not on file   Highest education level: Not on file  Occupational History   Not on file  Tobacco Use   Smoking status: Never   Smokeless tobacco: Never  Substance and Sexual Activity   Alcohol use: Not on  file   Drug use: Not on file   Sexual activity: Not on file  Other Topics Concern   Not  on file  Social History Narrative   Not on file   Social Drivers of Health   Financial Resource Strain: High Risk (07/14/2023)   Received [facility]   Overall Financial Resource Strain (CARDIA)    Difficulty of Paying Living Expenses: Very hard  Food Insecurity: No Food Insecurity (10/10/2023)   Hunger Vital Sign    Worried About Running Out of Food in the Last Year: Never true    Ran Out of Food in the Last Year: Never true  Transportation Needs: No Transportation Needs (07/23/2023)   Received from [facility] - Transportation    Lack of Transportation (Medical): No    Lack of Transportation (Non-Medical): No  Physical Activity: Inactive (07/14/2023)   Received from [facility]   Exercise Vital Sign    On average, how many days per week do you engage in moderate to strenuous exercise (like a brisk walk)?: 0 days    On average, how many minutes do you engage in exercise at this level?: 0 min  Stress: No Stress Concern Present (07/14/2023)   Received from [facility] - Occupational Stress Questionnaire    Feeling of Stress : Only a little  Social Connections: Socially Isolated (07/14/2023)   Received from [facility]   Social Connection and Isolation Panel    In a typical week, how many times do you talk on the phone with family, friends, or neighbors?: Never    How often do you get together with friends or relatives?: Never    How often do you attend church or religious services?: Never    Do you belong to any clubs or organizations such as church groups, unions, fraternal or athletic groups, or school groups?: No    How often do you attend meetings of the clubs or organizations you belong to?: Never    Are you married, widowed, divorced, separated, never married, or living with a partner?: Never married  Intimate Partner Violence: Not At Risk (07/14/2023)   Received from [facility]   Humiliation, Afraid,  Rape, and Kick questionnaire    Within the last year, have you been afraid of your partner or ex-partner?: No    Within the last year, have you been humiliated or emotionally abused in other ways by your partner or ex-partner?: No    Within the last year, have you been kicked, hit, slapped, or otherwise physically hurt by your partner or ex-partner?: No    Within the last year, have you been raped or forced to have any kind of sexual activity by your partner or ex-partner?: No     History reviewed. No pertinent family history.   Current Facility-Administered Medications:    bictegravir-emtricitabine -tenofovir  AF (BIKTARVY ) 50-200-25 MG per tablet 1 tablet, 1 tablet, Oral, Daily, Alexander, Natalie, DO, 1 tablet at 10/10/23 9082   busPIRone  (BUSPAR ) tablet 5 mg, 5 mg, Oral, TID, Alexander, Natalie, DO, 5 mg at 10/10/23 1618   calcium  carbonate (TUMS - dosed in mg elemental calcium ) chewable tablet 600 mg of elemental calcium , 3 tablet, Oral, TID PRN, Marsa Edelman, DO   cholecalciferol  (VITAMIN D3) 25 MCG (1000 UNIT) tablet 2,000 Units, 2,000 Units, Oral, Daily, Marsa Edelman, DO, 2,000 Units at 10/10/23 0916   dextrose  5 % in lactated ringers  infusion, , Intravenous, Continuous, Marsa Edelman, DO,  Last Rate: 75 mL/hr at 10/10/23 0728, New Bag at 10/10/23 9271   diphenhydrAMINE  (BENADRYL ) injection 25 mg, 25 mg, Intravenous, Q6H PRN, Alexander, Natalie, DO, 25 mg at 10/09/23 2342   dronabinol  (MARINOL ) capsule 5 mg, 5 mg, Oral, BID WC, Alexander, Natalie, DO, 5 mg at 10/10/23 9084   famotidine  (PEPCID ) tablet 10 mg, 10 mg, Oral, BID, Alexander, Natalie, DO, 10 mg at 10/10/23 9083   feeding supplement (ENSURE PLUS HIGH PROTEIN) liquid 237 mL, 237 mL, Oral, BID BM, Alexander, Natalie, DO, 237 mL at 10/10/23 1338   folic acid  (FOLVITE ) tablet 1 mg, 1 mg, Oral, Daily, Alexander, Natalie, DO, 1 mg at 10/10/23 9083   gabapentin  (NEURONTIN ) capsule 600 mg, 600 mg, Oral, TID, Alexander,  Natalie, DO, 600 mg at 10/10/23 1618   HYDROmorphone  (DILAUDID ) injection 0.5-1 mg, 0.5-1 mg, Intravenous, Q2H PRN, Alexander, Natalie, DO, 1 mg at 10/10/23 1619   ibuprofen  (ADVIL ) tablet 400 mg, 400 mg, Oral, Q6H PRN, Alexander, Natalie, DO   lidocaine  (LIDODERM ) 5 % 1-2 patch, 1-2 patch, Transdermal, Q24H, Alexander, Natalie, DO, 2 patch at 10/09/23 2359   magnesium  oxide (MAG-OX) tablet 400 mg, 400 mg, Oral, Daily, Alexander, Natalie, DO, 400 mg at 10/10/23 9083   methocarbamol  (ROBAXIN ) tablet 500 mg, 500 mg, Oral, QID PRN, Alexander, Natalie, DO, 500 mg at 10/09/23 2001   metoprolol  tartrate (LOPRESSOR ) tablet 12.5 mg, 12.5 mg, Oral, BID, Alexander, Natalie, DO, 12.5 mg at 10/10/23 0915   multivitamin with minerals tablet 1 tablet, 1 tablet, Oral, Daily, Alexander, Natalie, DO, 1 tablet at 10/10/23 0915   ondansetron  (ZOFRAN ) tablet 4 mg, 4 mg, Oral, Q6H PRN **OR** ondansetron  (ZOFRAN ) injection 4 mg, 4 mg, Intravenous, Q6H PRN, Alexander, Natalie, DO, 4 mg at 10/10/23 9486   sertraline  (ZOLOFT ) tablet 25 mg, 25 mg, Oral, Daily, Alexander, Natalie, DO, 25 mg at 10/10/23 9083   sulfamethoxazole -trimethoprim  (BACTRIM  DS) 800-160 MG per tablet 2 tablet, 2 tablet, Oral, Q8H, Alexander, Natalie, DO, 2 tablet at 10/10/23 1326   thiamine  (VITAMIN B1) tablet 100 mg, 100 mg, Oral, Daily, Alexander, Natalie, DO, 100 mg at 10/10/23 0915   traZODone  (DESYREL ) tablet 75 mg, 75 mg, Oral, QHS, Alexander, Natalie, DO, 75 mg at 10/09/23 2335   venlafaxine  XR (EFFEXOR -XR) 24 hr capsule 37.5 mg, 37.5 mg, Oral, Q breakfast, Alexander, Natalie, DO, 37.5 mg at 10/10/23 9082  Review of Systems  Constitutional:  Positive for appetite change, fatigue and unexpected weight change. Negative for chills and fever.  HENT:   Negative for hearing loss and voice change.   Eyes:  Negative for eye problems and icterus.  Respiratory:  Negative for chest tightness, cough and shortness of breath.   Cardiovascular:  Positive for  chest pain. Negative for leg swelling.  Gastrointestinal:  Positive for abdominal pain. Negative for abdominal distention.  Endocrine: Negative for hot flashes.  Genitourinary:  Negative for difficulty urinating, dysuria and frequency.   Musculoskeletal:  Positive for back pain. Negative for arthralgias.       Bilateral lower extremity pain  Skin:  Negative for itching and rash.  Neurological:  Positive for dizziness and extremity weakness. Negative for light-headedness and numbness.  Hematological:  Negative for adenopathy. Does not bruise/bleed easily.  Psychiatric/Behavioral:  Negative for confusion.     PHYSICAL EXAM Vitals:   10/10/23 0708 10/10/23 0812 10/10/23 1151 10/10/23 1624  BP:  118/88 103/68 101/65  Pulse:  (!) 119 85 90  Resp:  20  14  Temp:  (!) 97.5 F (  36.4 C) 97.7 F (36.5 C) (!) 97.5 F (36.4 C)  TempSrc:      SpO2: 98% 100% 98% 100%  Weight:      Height:       Physical Exam Constitutional:      General: He is not in acute distress.    Appearance: He is ill-appearing. He is not diaphoretic.     Comments: Cachectic  HENT:     Head: Normocephalic and atraumatic.  Eyes:     General: No scleral icterus. Cardiovascular:     Rate and Rhythm: Normal rate and regular rhythm.  Pulmonary:     Effort: Pulmonary effort is normal. No respiratory distress.     Comments: Decreased breath sound bilaterally On nasal cannula oxygen Abdominal:     General: There is no distension.     Palpations: Abdomen is soft.     Tenderness: There is no abdominal tenderness.  Musculoskeletal:     Cervical back: Normal range of motion and neck supple.     Comments: Decreased range of motion of bilateral knees  Skin:    General: Skin is warm and dry.     Findings: Lesion present. No erythema.  Neurological:     Mental Status: He is alert and oriented to person, place, and time. Mental status is at baseline.     Cranial Nerves: No cranial nerve deficit.     Motor: No abnormal  muscle tone.  Psychiatric:        Mood and Affect: Affect normal.          LABORATORY STUDIES    Latest Ref Rng & Units 10/10/2023    4:17 AM 10/09/2023    7:39 PM 10/09/2023    1:47 PM  CBC  WBC 4.0 - 10.5 K/uL 3.5  4.6  5.0   Hemoglobin 13.0 - 17.0 g/dL 89.2  89.5  89.5   Hematocrit 39.0 - 52.0 % 34.7  33.3  33.0   Platelets 150 - 400 K/uL 67  83  79       Latest Ref Rng & Units 10/10/2023    4:17 AM 10/09/2023    7:39 PM 10/09/2023    1:47 PM  CMP  Glucose 70 - 99 mg/dL 885   62   BUN 6 - 20 mg/dL 8   11   Creatinine 9.38 - 1.24 mg/dL 9.54  9.43  9.53   Sodium 135 - 145 mmol/L 136   137   Potassium 3.5 - 5.1 mmol/L 3.4   3.4   Chloride 98 - 111 mmol/L 98   98   CO2 22 - 32 mmol/L 34   28   Calcium  8.9 - 10.3 mg/dL 6.8   6.9   Total Protein 6.5 - 8.1 g/dL 8.2   8.5   Total Bilirubin 0.0 - 1.2 mg/dL 1.1   1.8   Alkaline Phos 38 - 126 U/L 433   495   AST 15 - 41 U/L 64   141   ALT 0 - 44 U/L 18   23      RADIOGRAPHIC STUDIES: I have personally reviewed the radiological images as listed and agreed with the findings in the report. CT ABDOMEN PELVIS WO CONTRAST Result Date: 10/09/2023 CLINICAL DATA:  Abdominal pain, acute, nonlocalized hematuria, evaluate for stone or other cause, renal US  nonrevealing EXAM: CT ABDOMEN AND PELVIS WITHOUT CONTRAST TECHNIQUE: Multidetector CT imaging of the abdomen and pelvis was performed following the standard protocol without IV contrast. RADIATION DOSE  REDUCTION: This exam was performed according to the departmental dose-optimization program which includes automated exposure control, adjustment of the mA and/or kV according to patient size and/or use of iterative reconstruction technique. COMPARISON:  No ultrasound earlier today. Contrast-enhanced abdominopelvic CT 09/08/2023 FINDINGS: Lower chest: Assessed on chest CTA performed can concurrently, reported separately. Hepatobiliary: Stable hypodense liver lesions, largest measuring 2.6 cm. Direct  comparison is limited in the absence of IV contrast. Gallbladder physiologically distended, no calcified stone. No biliary dilatation. Pancreas: Not well assessed in the absence of contrast and paucity of intra-abdominal fat. No obvious inflammation. Spleen: Calcified splenic granulomas. The splenic lesions on prior are not demonstrated in the absence of contrast. No splenomegaly. Adrenals/Urinary Tract: No adrenal nodule. No hydronephrosis or renal calculi. No evidence of renal mass on this unenhanced exam. Partially distended urinary bladder, improved bladder wall thickening from prior. Stomach/Bowel: Suboptimally assessed in the absence of contrast and paucity of intra-abdominal fat. Suggestion of gastric wall thickening. No small bowel distension or evidence of obstruction. Minimal formed stool in the colon. A few colonic diverticula in the descending and sigmoid. Vascular/Lymphatic: Normal caliber abdominal aorta. No evidence of portal venous or mesenteric gas. Adenopathy assessment is limited in the absence of contrast and paucity of body fat. The soft tissue thickening within the upper retroperitoneum on prior is suboptimally assessed on the current exam. Reproductive: Prostate is unremarkable. Other: Small amount of free fluid in the pelvis, improved from prior. Mild generalized body wall edema. Infiltrative changes in the subcutaneous tissues of the upper thigh 6, without significant change. Musculoskeletal: Innumerable lucent lesions throughout the skeleton. Small lucent lesion in the right femoral neck. No obvious extraosseous soft tissue component. IMPRESSION: 1. No renal stones or obstructive uropathy. 2. Suggestion of gastric wall thickening, can be seen with gastritis. 3. Innumerable lucent lesions throughout the skeleton, unchanged from prior. 4. Grossly stable hypodense liver lesions, suboptimally assessed in the absence of IV contrast. 5. Small amount of free fluid in the pelvis, improved from  prior. 6. Mild generalized body wall edema. Infiltrative changes in the subcutaneous tissues of the upper thigh, without significant change. Electronically Signed   By: Andrea Gasman M.D.   On: 10/09/2023 18:55   CT Angio Chest Pulmonary Embolism (PE) W or WO Contrast Result Date: 10/09/2023 CLINICAL DATA:  Provided history: Cpugh and SOB, known lung complications of kaposi sarcoma, checking for PE EXAM: CT ANGIOGRAPHY CHEST WITH CONTRAST TECHNIQUE: Multidetector CT imaging of the chest was performed using the standard protocol during bolus administration of intravenous contrast. Multiplanar CT image reconstructions and MIPs were obtained to evaluate the vascular anatomy. RADIATION DOSE REDUCTION: This exam was performed according to the departmental dose-optimization program which includes automated exposure control, adjustment of the mA and/or kV according to patient size and/or use of iterative reconstruction technique. CONTRAST:  75mL OMNIPAQUE  IOHEXOL  350 MG/ML SOLN COMPARISON:  Radiograph earlier today. Most recent chest CTA 09/22/2023, this is the patient's third chest CTA in the last month. FINDINGS: Cardiovascular: There are no filling defects within the pulmonary arteries to suggest pulmonary embolus. Dilated main pulmonary artery at 3.8 cm. The heart is mildly enlarged, chronic. No aortic dissection or acute aortic findings. No pericardial effusion. Mediastinum/Nodes: Again seen subcarinal node/mass on the right, measuring 3.6 cm AP, previously 3.2 cm using same measurement technique. This causes mass effect on the left atrium. Increasing soft tissue thickening in the anterior mediastinum and prevascular space. No obvious esophageal wall thickening. Lungs/Pleura: Again seen chronic lung disease. There are  small bilateral pleural effusions and thickening that is stable from prior exam. Chronic scarring and bronchiectasis in the upper lungs with subpleural cystic changes, stable. Widespread bilateral  peribronchovascular ill-defined nodules and nodularity again seen. Progressive areas of ground-glass in the lower lobes from prior trachea and central airways are clear. Upper Abdomen: Assessed on concurrent abdominopelvic CT, reported separately. Musculoskeletal: Again seen innumerable lytic lesions throughout the skeleton. There is no obvious extra-axial component or encroachment on the spinal canal. Generalized paucity of body fat may represent cachexia. Review of the MIP images confirms the above findings. IMPRESSION: 1. No pulmonary embolus. 2. Bilateral pleural effusions and thickening, stable from prior exam. 3. Chronic scarring and bronchiectasis with upper lobe predominant subpleural cystic change, stable widespread bilateral peribronchovascular ill-defined nodules and nodularity again seen. 4. Progressive areas of ground-glass in the lower lobes from prior exam, may be infectious or inflammatory. 5. Increasing subcarinal node or mass causing mass effect on the left atrium. Increasing soft tissue thickening in the anterior mediastinum and prevascular space, may be neoplastic or inflammatory. 6. Again seen innumerable lytic lesions throughout the skeleton. 7. Dilated main pulmonary artery at 3.8 cm, can be seen with pulmonary arterial hypertension. Electronically Signed   By: Andrea Gasman M.D.   On: 10/09/2023 18:48   US  Renal Result Date: 10/09/2023 CLINICAL DATA:  Hematuria. EXAM: RENAL / URINARY TRACT ULTRASOUND COMPLETE COMPARISON:  September 08, 2023. FINDINGS: Right Kidney: Renal measurements: 10.7 x 4.7 x 5.2 cm = volume: 138 mL. Mildly increased echogenicity of renal parenchyma is noted suggesting medical renal disease. No mass or hydronephrosis visualized. Left Kidney: Renal measurements: 10.1 x 5.6 x 5.2 cm = volume: 155 mL. Mildly increased echogenicity of renal parenchyma is noted suggesting medical renal disease. No mass or hydronephrosis visualized. Bladder: Mildly thickened bladder wall is  noted most likely due to lack of distension, but cystitis cannot be excluded. Other: None. IMPRESSION: 1. Mildly increased echogenicity of renal parenchyma is noted bilaterally suggesting medical renal disease. No hydronephrosis or renal obstruction is noted. 2. Mildly thickened urinary bladder wall is noted most likely due to lack of distension, but cystitis cannot be excluded. Electronically Signed   By: Lynwood Landy Raddle M.D.   On: 10/09/2023 16:09   DG Chest Portable 1 View Result Date: 10/09/2023 CLINICAL DATA:  Shortness of breath EXAM: PORTABLE CHEST 1 VIEW COMPARISON:  September 22, 2023 FINDINGS: Slightly increased patchy opacities at the right lung base and left mid lung compared to prior. Additional chronic interstitial opacities bilaterally. Small bilateral pleural effusions. No pneumothorax. Unchanged cardiomediastinal silhouette. No acute osseous findings. IMPRESSION: Chronic heterogeneous opacities bilaterally with slightly increased patchy opacities in the right lower lung and left mid lung, may represent acute infection/inflammation. Unchanged small bilateral pleural effusions. Electronically Signed   By: Michaeline Blanch M.D.   On: 10/09/2023 14:18   CT Angio Chest PE W and/or Wo Contrast Result Date: 09/22/2023 CLINICAL DATA:  Chest pain EXAM: CT ANGIOGRAPHY CHEST WITH CONTRAST TECHNIQUE: Multidetector CT imaging of the chest was performed using the standard protocol during bolus administration of intravenous contrast. Multiplanar CT image reconstructions and MIPs were obtained to evaluate the vascular anatomy. RADIATION DOSE REDUCTION: This exam was performed according to the departmental dose-optimization program which includes automated exposure control, adjustment of the mA and/or kV according to patient size and/or use of iterative reconstruction technique. CONTRAST:  53mL OMNIPAQUE  IOHEXOL  350 MG/ML SOLN COMPARISON:  09/22/2023 chest x-ray, chest CT 09/08/2023, 08/08/2022 FINDINGS:  Cardiovascular: Satisfactory opacification of  the pulmonary arteries to the segmental level. No evidence of pulmonary embolism. Nonaneurysmal aorta. No dissection. Mediastinum/Nodes: Patent trachea. No suspicious thyroid mass. Stable soft tissue thickening within the mediastinum/pre-vascular space. Subcarinal node or mass measures 3.2 cm, previously 3.2 cm when measured in similar fashion. Esophagus is unremarkable Lungs/Pleura: Similar small complex pleural effusions or pleural disease. Chronic scarring and bronchiectasis in the upper lungs with upper lobe predominant subpleural cystic changes, stable compared with recent CT earlier this month, progressive cystic changes compared with July exam. Widespread bilateral peribronchovascular ill-defined nodules and nodularity. Similar appearance of ground-glass density in the right greater than left lower lobes. No pneumothorax. Upper Abdomen: Hypodense liver lesions are grossly similar. Musculoskeletal: Numerous lucent lesions within the sternum and spine consistent with metastatic disease. Review of the MIP images confirms the above findings. IMPRESSION: 1. Negative for acute pulmonary embolus or aortic dissection. 2. Similar appearance of small complex pleural effusions or pleural disease. 3. Chronic scarring and bronchiectasis in the upper lungs with upper lobe predominant subpleural cystic changes, stable compared with recent CT earlier this month, progressive cystic changes compared with July exam. Widespread bilateral peribronchovascular ill-defined nodules and nodularity, similar compared with recent CT earlier this month. Similar appearance of ground-glass density in the right greater than left lower lobes either due to infection, inflammatory process, or metastatic disease. 4. Stable soft tissue thickening within the mediastinum/pre-vascular space and stable enlarged subcarinal node or mass consistent with metastatic disease. 5. Numerous lucent lesions within  the sternum and spine consistent with metastatic disease. 6. Hypodense liver lesions are grossly similar. Electronically Signed   By: Luke Bun M.D.   On: 09/22/2023 17:57   DG Chest 2 View Result Date: 09/22/2023 CLINICAL DATA:  chest pain. EXAM: CHEST - 2 VIEW COMPARISON:  09/08/2023. FINDINGS: Redemonstration of heterogeneous nonspecific opacities throughout bilateral lungs with upper lobe predominance. No significant interval change. No acute consolidation in the interim. Redemonstration of bilateral small pleural effusions without significant interval change as well. Surgical suture noted overlying the right lung apex. There is bilateral apical smooth opacification, which may represent pleural thickening versus effusion. Stable cardio-mediastinal silhouette. No acute osseous abnormalities. The soft tissues are within normal limits. IMPRESSION: No significant interval change since the prior study. Redemonstration of heterogeneous nonspecific opacities throughout bilateral lungs with upper lobe predominance. No acute consolidation in the interim. Redemonstration of bilateral small pleural effusions without significant interval change as well. Electronically Signed   By: Ree Molt M.D.   On: 09/22/2023 12:54   CT ABDOMEN PELVIS W CONTRAST Result Date: 09/08/2023 CLINICAL DATA:  Pain dysuria EXAM: CT ABDOMEN AND PELVIS WITH CONTRAST TECHNIQUE: Multidetector CT imaging of the abdomen and pelvis was performed using the standard protocol following bolus administration of intravenous contrast. RADIATION DOSE REDUCTION: This exam was performed according to the departmental dose-optimization program which includes automated exposure control, adjustment of the mA and/or kV according to patient size and/or use of iterative reconstruction technique. CONTRAST:  75mL OMNIPAQUE  IOHEXOL  350 MG/ML SOLN COMPARISON:  CT 07/09/2013 FINDINGS: Lower chest: Lung bases demonstrate small bilateral complex pleural effusions  versus pleural metastatic disease, slightly nodular appearance of the pleural surface. Mild cardiomegaly. Hepatobiliary: No calcified gallstone. Possible mild gallbladder wall thickening. No biliary dilatation. Water density lesion in the anterior right liver measuring 2.5 cm probably a cyst but numerous additional subcentimeter hypodensities throughout the liver parenchyma. Pancreas: Unremarkable. No pancreatic ductal dilatation or surrounding inflammatory changes. Spleen: Numerous subcentimeter hypodensities within the spleen Adrenals/Urinary Tract: Adrenal glands are  normal. Kidneys show no hydronephrosis. The bladder is diffusely thick walled Stomach/Bowel: The stomach is nonenlarged. There is no dilated small bowel. No acute bowel wall thickening Vascular/Lymphatic: Nonaneurysmal aorta. Ill-defined soft tissue thickening within the retroperitoneum surrounding the SMA and celiac arteries. Ill-defined hypodensity at the level of the renal arteries and veins as well. Scattered mildly prominent retroperitoneal lymph nodes. Reproductive: Negative prostate Other: No free air.  Small volume free fluid in the pelvis. Musculoskeletal: Edema and soft tissue stranding within the pelvis. Abnormal diffuse skin thickening and nodularity of the anterior proximal thighs with infiltrated appearance of the subcutaneous soft tissues. Multiple lucent lesions within the spine and pelvis consistent with osseous metastatic disease. IMPRESSION: 1. Small bilateral complex pleural effusions and or pleural metastatic disease. 2. Numerous subcentimeter hypodensities throughout the liver and spleen, indeterminate for metastatic disease versus disseminated infection. 3. Ill-defined soft tissue thickening within the retroperitoneum as described above, indeterminate for matted adenopathy/metastatic disease. 4. Diffuse bladder wall thickening, question cystitis. 5. Small volume free fluid in the pelvis. 6. Abnormal diffuse skin thickening and  nodularity of the anterior proximal thighs and hips with infiltrated appearance of the soft tissues, correlate with direct inspection for cutaneous disease 7. Multiple lucent lesions within the spine and pelvis consistent with osseous metastatic disease. Electronically Signed   By: Luke Bun M.D.   On: 09/08/2023 20:30   CT Angio Chest PE W and/or Wo Contrast Result Date: 09/08/2023 CLINICAL DATA:  Chest pain EXAM: CT ANGIOGRAPHY CHEST WITH CONTRAST TECHNIQUE: Multidetector CT imaging of the chest was performed using the standard protocol during bolus administration of intravenous contrast. Multiplanar CT image reconstructions and MIPs were obtained to evaluate the vascular anatomy. RADIATION DOSE REDUCTION: This exam was performed according to the departmental dose-optimization program which includes automated exposure control, adjustment of the mA and/or kV according to patient size and/or use of iterative reconstruction technique. CONTRAST:  75mL OMNIPAQUE  IOHEXOL  350 MG/ML SOLN COMPARISON:  CT chest 08/08/2022, 05/07/2022 FINDINGS: Cardiovascular: Satisfactory opacification of the pulmonary arteries to the segmental level. No evidence of pulmonary embolism. Nonaneurysmal aorta. Pulmonary trunk is dilated up to 3.6 cm. Borderline cardiomegaly. No significant pericardial effusion. Mediastinum/Nodes: Patent trachea. No thyroid mass. Possible matted supraclavicular nodes, series 5 image 18. No thyroid mass. Soft tissue thickening in the pre-vascular space, probable matted nodes. Abnormal soft tissue thickening throughout the mediastinum. Redemonstrated large subcarinal node or mass measuring 4.8 x 3.3 cm, previously 4.4 x 3.4 cm. Esophagus is unremarkable. Lungs/Pleura: Compared with 08/08/2022, increased slightly dense pleural fluid and or possible pleural metastatic disease. Bronchiectasis, curvilinear bandlike densities and distortion in the upper lobes similar compared to prior and likely reflecting post  inflammatory scarring. Age advanced emphysema. Increased loculated appearing air collections at the apices and left lateral chest, difficult to exclude loculated pneumothoraces. New ground-glass densities and mild consolidation in the right greater than left lower lobes, possible acute pneumonia. Underlying peribronchovascular nodularity. Upper Abdomen: See separately dictated CT Musculoskeletal: No fracture. Multiple lucent lesions within the sternum and spine, slightly progressive compared to the most recent prior CT 1 and suspect for metastatic disease. Review of the MIP images confirms the above findings. IMPRESSION: 1. Negative for acute pulmonary embolus. 2. New ground-glass densities and mild consolidation in the right greater than left lower lobes, suspicious for acute pneumonia. Underlying peribronchovascular nodularity most evident in the lower lobes which may be infectious/inflammatory versus neoplastic in etiology 3. Compared with 08/08/2022, increased small bilateral slightly complex pleural effusions versus pleural metastatic disease. Increased  loculated appearing air collections at the apices and left lateral chest, difficult to exclude loculated pneumothoraces. Age advanced emphysema 4. Abnormal soft tissue thickening throughout the mediastinum and supraclavicular regions, suspect for matted adenopathy. Redemonstrated large subcarinal node or mass measuring 4.8 x 3.3 cm, previously 4.4 x 3.4 cm. 5. Multiple lucent lesions within the sternum and spine, slightly progressive compared to the most recent prior CT and suspect for metastatic disease. 6. Age advanced emphysema. 7. Dilated pulmonary trunk up to 3.6 cm, can be seen with pulmonary arterial hypertension. Emphysema (ICD10-J43.9). Electronically Signed   By: Luke Bun M.D.   On: 09/08/2023 20:14   US  Venous Img Lower Unilateral Left Result Date: 09/08/2023 CLINICAL DATA:  Left lower extremity swelling and pain for 3 days EXAM: LEFT LOWER  EXTREMITY VENOUS DOPPLER ULTRASOUND TECHNIQUE: Gray-scale sonography with graded compression, as well as color Doppler and duplex ultrasound were performed to evaluate the lower extremity deep venous systems from the level of the common femoral vein and including the common femoral, femoral, profunda femoral, popliteal and calf veins including the posterior tibial, peroneal and gastrocnemius veins when visible. Spectral Doppler was utilized to evaluate flow at rest and with distal augmentation maneuvers in the common femoral, femoral and popliteal veins. COMPARISON:  None Available. FINDINGS: Contralateral Common Femoral Vein: Respiratory phasicity is normal and symmetric with the symptomatic side. No evidence of thrombus. Normal compressibility. Common Femoral Vein: No evidence of thrombus. Normal compressibility, respiratory phasicity and response to augmentation. Saphenofemoral Junction: No evidence of thrombus. Normal compressibility and flow on color Doppler imaging. Profunda Femoral Vein: No evidence of significant thrombus. Normal compressibility and flow on color Doppler imaging. Limited visualization. Femoral Vein: No evidence of thrombus. Normal compressibility, respiratory phasicity and response to augmentation. Popliteal Vein: No evidence of significant thrombus. Normal compressibility, respiratory phasicity and response to augmentation. Limited visualization. Calf Veins: No evidence of thrombus. Normal compressibility and flow on color Doppler imaging. Other Findings: Exam is limited because of the patient's inability to tolerate compression imaging and he was also not able to extend his leg during the exam. IMPRESSION: No significant left lower extremity DVT by ultrasound. Limited exam as above. Electronically Signed   By: CHRISTELLA.  Shick M.D.   On: 09/08/2023 16:30   DG Chest 2 View Result Date: 09/08/2023 EXAM: 2 VIEW(S) XRAY OF THE CHEST 09/08/2023 01:52:00 PM COMPARISON: 08/08/2022 CLINICAL HISTORY:  cp. abd pain with N/D x3 days. Currently has cancer- suppose to started back chemo soon. Wears 2L [place] at baseline. C/o CP and SOB as well. FINDINGS: LUNGS AND PLEURA: Small bilateral pleural effusions. Upper lobe predominant interstitial thickening and peribronchovascular pulmonary opacities with architectural distortion and hilar retraction are grossly similar. this limits evaluation for superimposed acute airspace disease. HEART AND MEDIASTINUM: No acute abnormality of the cardiac and mediastinal silhouettes. BONES AND SOFT TISSUES: No acute osseous abnormality. IMPRESSION: 1. Upper lobe predominant interstitial thickening and peribronchovascular pulmonary opacities with architectural distortion are grossly similar to 08/08/2022 and likely due to prior infection or inflammation. this limits evaluation for superimposed acute process. 2. Small bilateral pleural effusions. Electronically signed by: Rockey Kilts MD 09/08/2023 02:06 PM EDT RP Workstation: [id number]     Assessment and plan-   # AIDS related Kaposi sarcoma, extensive convinced lesions, with visceral and bone metastasis [id number] He has received at least 2 lines of systemic chemotherapy, as well as local therapy with hepatic embolization.  Unclear if patient has had immunotherapy. Currently liver lesion appears to be stable in size.  He has relocated to [place]. He has very poor performance status and poor nutrition status.  Prognosis is poor.  Discussed with him about hospice option.  Patient desires to explore further cancer treatments. Recommend patient to establish care with infectious disease.  Continue Combined antiretroviral therapy  Check CD4 counts Pain control,palliative care evaluation.  Will obtain previous oncology records.  I recommend patient to establish care with [facility].  Will coordinate for patient.   #?  PCP pneumonia.  # Severe malnutrition recommended nutrition supplements. # Thrombocytopenia, check B12,  folate, immature platelet count # Chronic respiratory failure, on nasal cannula oxygen.  Thank you for allowing me to participate in the care of this patient.   Zelphia Cap, MD, PhD Hematology Oncology 10/10/2023

## 2023-10-10 NOTE — Telephone Encounter (Signed)
 error

## 2023-10-10 NOTE — Progress Notes (Signed)
 Secure e-mail send to Vernell Gully, financial navigator, regarding patient's insurance status. TOC made aware.

## 2023-10-10 NOTE — Progress Notes (Addendum)
 Patient admitted to room 116 from ED. A+Ox4. Tachycardic otherwise Vital Signs stable. Patient nauseated (Zofran  available) will observe pain management due to metastasized Kaposi Sarcoma. Bed locked and set to lowest position with bed alarm, call bell and needs within reach. Will continue to monitor with plan of care.

## 2023-10-10 NOTE — Consult Note (Signed)
 Consultation Note Date: 10/10/2023   Patient Name: Warren Farrell  DOB: 1990-12-25  MRN: [id number]  Age / Sex: 33 y.o., male  PCP: Pcp, No Referring Physician: Marsa Edelman, DO  Reason for Consultation: Establishing goals of care   HPI/Brief Hospital Course: 33 y.o. male  with past medical history of HIV/AIDS and Kaposi's sarcoma with metastatic disease to liver, bone, lung and lymph nodes, chronic pain, history of PE no longer on [facility], G6PD admitted from home on 10/09/2023 with hyperglycemia, chest pain, back pain, hematuria and N/V.  CBG found to be 52 treated by EMS with improvement only to 56.  In ED remained hyperglycemic, calcium  6.9, ALP 495, albumin 1.9, AST 141, platelets 79--CXR revealing slight increase of patchy opacities in right lower lung and left midlung  CT abdomen IMPRESSION: 1. No renal stones or obstructive uropathy. 2. Suggestion of gastric wall thickening, can be seen with gastritis. 3. Innumerable lucent lesions throughout the skeleton, unchanged from prior. 4. Grossly stable hypodense liver lesions, suboptimally assessed in the absence of IV contrast. 5. Small amount of free fluid in the pelvis, improved from prior. 6. Mild generalized body wall edema. Infiltrative changes in the subcutaneous tissues of the upper thigh, without significant change.  CTA chest IMPRESSION: 1. No pulmonary embolus. 2. Bilateral pleural effusions and thickening, stable from prior exam. 3. Chronic scarring and bronchiectasis with upper lobe predominant subpleural cystic change, stable widespread bilateral peribronchovascular ill-defined nodules and nodularity again seen. 4. Progressive areas of ground-glass in the lower lobes from prior exam, may be infectious or inflammatory. 5. Increasing subcarinal node or mass causing mass effect on the left atrium. Increasing soft tissue thickening in the anterior mediastinum and prevascular space, may  be neoplastic or inflammatory. 6. Again seen innumerable lytic lesions throughout the skeleton. 7. Dilated main pulmonary artery at 3.8 cm, can be seen with pulmonary arterial hypertension.  Noted recent admission 5/16 - 6/18 for P JP pneumonia complicated by Enterococcus and MRSA bacteremia--required intubation  Palliative medicine was consulted for assisting with goals of care conversations.  Subjective:  Extensive chart review has been completed prior to meeting patient including labs, vital signs, imaging, progress notes, orders, and available advanced directive documents from current and previous encounters.  Visited with Warren Farrell at his bedside.  He is awake, alert, and able to engage in conversations.  He was sitting up in recliner, noted productive cough.  His sister Sharene is at bedside during time of visit.  Introduced myself as a Publishing rights manager as a member of the palliative care team. Explained palliative medicine is specialized medical care for people living with serious illness. It focuses on providing relief from the symptoms and stress of a serious illness. The goal is to improve quality of life for Farrell the patient and the family.   Warren Farrell shares he is currently living with his sister Sharene.  At baseline he is able to transfer with assistance and requires assistance with ADLs.  His sister has adult children in the home who assist her in caring for Warren Farrell.  Warren Farrell shares the last time he was able to ambulate was several months prior.  He discusses a progressive functional decline over the last several months.  Warren Farrell shares he has other siblings but Sharene is most involved in his healthcare and wishes to appoint her as [employer] Clinical research associate.  Warren Farrell shares his last oncological treatment was about a year ago and has not had follow-up since, previously followed  by an oncologist in [place].  We discussed patient's current illness and what it means in the larger  context of patient's on-going co-morbidities. Natural disease trajectory and expectations at EOL were discussed.   Warren Farrell and Sharene share their understanding that his underlying metastatic disease has advanced.  Aware medical team has requested oncology team to weigh in on possibility of further treatment/interventions.  Warren Farrell expresses being aware that his disease may be too far advanced and he may be approaching end-of-life.  He expresses fear and sadness he has with this understanding.  Therapeutic silence and emotional support provided.  Assessed symptoms.  He continues to report generalized moderate to severe pain.  Pain is relieved with as needed Dilaudid . Recommend consideration of long acting opioids to assist with pain management. Appetite remains hearty but he is only able to tolerate a few bites at each meal.  Attempted to elicit goals of care.  We discussed CODE STATUS and the difference between full code and DO NOT RESUSCITATE. Encouraged patient/family to consider DNR/DNI status understanding evidenced based poor outcomes in similar hospitalized patients, as the cause of the arrest is likely associated with chronic/terminal disease rather than a reversible acute cardio-pulmonary event.  Latoya encourages Warren Farrell to consider DNR status as she feels resuscitative efforts would cause him more pain and suffering.  Warren Farrell shares his understanding and wishes to have further discussions with his sister and think about this further prior to making a final decision.  He is anticipating a visit from oncology later this afternoon to gain further information into his overall prognosis and then plans to have discussions regarding continuing aggressive measures versus consideration of pursuing end-of-life/comfort/hospice care.  I discussed importance of continued conversations with family/support persons and all members of their medical team regarding overall plan of care and treatment options  ensuring decisions are in alignment with patients goals of care.  All questions/concerns addressed. Emotional support provided to patient/family/support persons. PMT will continue to follow and support patient as needed.  Objective: Primary Diagnoses: Present on Admission:  Kaposi sarcoma [facility])   Physical Exam Constitutional:      General: He is not in acute distress.    Appearance: He is cachectic. He is ill-appearing.  Pulmonary:     Effort: Pulmonary effort is normal. No respiratory distress.  Skin:    General: Skin is warm and dry.  Neurological:     Mental Status: He is alert and oriented to person, place, and time.     Motor: Weakness present.  Psychiatric:        Mood and Affect: Mood normal.        Behavior: Behavior normal.        Thought Content: Thought content normal.     Vital Signs: BP 101/65 (BP Location: Right Arm)   Pulse 90   Temp (!) 97.5 F (36.4 C)   Resp 14   Ht 6' (1.829 m)   Wt 51.3 kg   SpO2 100%   BMI 15.33 kg/m  Pain Scale: 0-10   Pain Score: Asleep  IO: Intake/output summary:  Intake/Output Summary (Last 24 hours) at 10/10/2023 1723 Last data filed at 10/10/2023 1300 Gross per 24 hour  Intake 1729.05 ml  Output 200 ml  Net 1529.05 ml    LBM: Last BM Date : 10/09/23 Baseline Weight: Weight: 51.3 kg Most recent weight: Weight: 51.3 kg      Assessment and Plan  SUMMARY OF RECOMMENDATIONS   Await oncology consultation--overall prognosis Ongoing  GOC needed PMT to continue to follow for ongoing needs and support  Palliative Prophylaxis:   Bowel Regimen, Delirium Protocol and Frequent Pain Assessment  Discussed With: Primary team   Thank you for this consult and allowing Palliative Medicine to participate in the care of Warren Farrell. Palliative medicine will continue to follow and assist as needed.   Time Total: 75 minutes  Time spent includes: Detailed review of medical records (labs, imaging, vital signs), medically  appropriate exam (mental status, respiratory, cardiac, skin), discussed with treatment team, counseling and educating patient, family and staff, documenting clinical information, medication management and coordination of care.   Signed by: Waddell Lesches, DNP, AGNP-C Palliative Medicine    Please contact Palliative Medicine Team phone at [phone] for questions and concerns.  For individual provider: See Tracey

## 2023-10-10 NOTE — Hospital Course (Addendum)
 Hospital course / significant events:   HPI: Warren Farrell is a 33 y.o. male w/ Hx HIV/AIDS and Kaposi sarcoma metastatic to liver and lymph nodes, chronic pain, previous PE no longer on [facility], G6PD, s/p prolonged hospitalization/rehab 5/16 - 07/23/23 for PJP pneumonia complicated by Enterococcus and MRSE bacteremia, required intubation. Problems with insurance, has not been able to follow w/ oncology / ID. Today 10/09/23 presents to ED [facility] from home for hypoglycemia, chest pain, back pain, hematuria, emesis. Cbg 52, received tube of glucose w/ EMS, improvement to 56.    09/04: to ED. VSS except mild tachypnea to max 26, 100% SpO2 on his baseline 2L [place], Glc 62, hypocalcemia 6.9, ALP elevated 495, hypoalbuminemia 1.9, mild transaminitis AST 141, GFR >60, Hgb 10.4 about baseline, thrombocytopenia Plt 79, UA (+)proteinuria >300, 0-5 RBC/hpf. Troponin neg/flat. Chronic heterogeneous opacities bilaterally with slightly increased patchy opacities in the right lower lung and left mid lung, may represent acute infection/inflammation. Renal US  concerning for medical renal disease. Hospitalist asked to admit. I spoke w/ Dr Babara oncology to confirm that cancer tx would be outpatient anyway so no need for ED to ED transfer to [facility]. Admission for pain control, renal w/u, eval for PE/PNA w/ chest CT, eval abd pain / gross metastasis w/ CT abd/pelv. Pt requested palliative care discussion, full code at this time  09/05: palliative and oncology consults --> pt would like to pursue tx, goal to get him set up w/ [facility]. Poor overall prognosis, desires FULL CODE 09/06: hypotension / hypothermia overnight. Some imrpved w/ bair hugger, IV fluids, albumin . Pending PICC access to run albumin , mg, etc. PCCU consult, suspect may need pressors. I d/w patient re: if BP continues to drop and his organs start failing, we will not be able to help him with CPR. Attempt w/ pt's permission to call his sister, no answer / no voicemail set up.  Palliative care d/w patient further today, decision made for DNR  09/07: continues to have intractable pain, progressively drowsy today despite cutting back on pain rx, diaphoretic, decision made w/ patient/sister for comfort measures only and allow natural death with dignity, focus on pain control.    Consultants:  Palliative care  Medical oncology  PCCU   Procedures/Surgeries: None       ASSESSMENT & PLAN:   Kaposi sarcoma w/ associated metastatic disease including lungs, spine, sternum which is causing chest/back pain  HIV/AIDS  Hypotension Hypothermia Concern for severe SIRS/Sepsis Question PCP pneumonia Acute on Chronic hypoxic respiratory failure. Progressive Hypoglycemia Poor po intake  Echo concerning for RV strain / pulm HTN Hypocalcemia  ALP elevated 495 --> trend down 293 mild transaminitis AST 141 --> WNL Liver involvement / met disease hypoalbuminemia 1.9 --> 1.6 Severe malnutrition  Chronic normocytic anemia Progressive thrombocytopenia Plt 83 --> 41 --> 31  End stage disease w/ progressive pain and organ dysfunction in setting of very poor baseline reserves  Comfort measures only  Titrate pain medications as needed Symptomatic care  No labs  Minimal VS to monitor      DVT prophylaxis: n/a on comfort measures IV fluids: dc on comfort measures Nutrition: diet as tolerated / as desired  Central lines / other devices: none  Code Status: DNR ACP documentation reviewed:  none on file in [facility]

## 2023-10-11 ENCOUNTER — Other Ambulatory Visit: Payer: Self-pay

## 2023-10-11 ENCOUNTER — Inpatient Hospital Stay
Admit: 2023-10-11 | Discharge: 2023-10-11 | Disposition: A | Discharge status: Home / Self Care | Payer: Medicaid - Out of State | Attending: Osteopathic Medicine | Admitting: Osteopathic Medicine

## 2023-10-11 DIAGNOSIS — E162 Hypoglycemia, unspecified: Secondary | ICD-10-CM | POA: Diagnosis not present

## 2023-10-11 DIAGNOSIS — C469 Kaposi's sarcoma, unspecified: Secondary | ICD-10-CM | POA: Diagnosis not present

## 2023-10-11 DIAGNOSIS — R008 Other abnormalities of heart beat: Secondary | ICD-10-CM | POA: Diagnosis not present

## 2023-10-11 DIAGNOSIS — E46 Unspecified protein-calorie malnutrition: Secondary | ICD-10-CM | POA: Diagnosis not present

## 2023-10-11 DIAGNOSIS — J189 Pneumonia, unspecified organism: Secondary | ICD-10-CM | POA: Diagnosis not present

## 2023-10-11 LAB — BLOOD GAS, ARTERIAL
Acid-Base Excess: 8.8 mmol/L — ABNORMAL HIGH (ref 0.0–2.0)
Bicarbonate: 36.7 mmol/L — ABNORMAL HIGH (ref 20.0–28.0)
O2 Saturation: 99.6 %
Patient temperature: 37
pCO2 arterial: 65 mmHg — ABNORMAL HIGH (ref 32–48)
pH, Arterial: 7.36 (ref 7.35–7.45)
pO2, Arterial: 100 mmHg (ref 83–108)

## 2023-10-11 LAB — CBC WITH DIFFERENTIAL/PLATELET
Abs Immature Granulocytes: 0.1 K/uL — ABNORMAL HIGH (ref 0.00–0.07)
Basophils Absolute: 0 K/uL (ref 0.0–0.1)
Basophils Relative: 0 %
Eosinophils Absolute: 0 K/uL (ref 0.0–0.5)
Eosinophils Relative: 0 %
HCT: 34.1 % — ABNORMAL LOW (ref 39.0–52.0)
Hemoglobin: 10.2 g/dL — ABNORMAL LOW (ref 13.0–17.0)
Immature Granulocytes: 1 %
Lymphocytes Relative: 13 %
Lymphs Abs: 0.9 K/uL (ref 0.7–4.0)
MCH: 31.2 pg (ref 26.0–34.0)
MCHC: 29.9 g/dL — ABNORMAL LOW (ref 30.0–36.0)
MCV: 104.3 fL — ABNORMAL HIGH (ref 80.0–100.0)
Monocytes Absolute: 0.3 K/uL (ref 0.1–1.0)
Monocytes Relative: 4 %
Neutro Abs: 5.6 K/uL (ref 1.7–7.7)
Neutrophils Relative %: 82 %
Platelets: 41 K/uL — ABNORMAL LOW (ref 150–400)
RBC: 3.27 MIL/uL — ABNORMAL LOW (ref 4.22–5.81)
RDW: 16.1 % — ABNORMAL HIGH (ref 11.5–15.5)
WBC: 6.9 K/uL (ref 4.0–10.5)
nRBC: 5.7 % — ABNORMAL HIGH (ref 0.0–0.2)

## 2023-10-11 LAB — GLUCOSE, CAPILLARY
Glucose-Capillary: 124 mg/dL — ABNORMAL HIGH (ref 70–99)
Glucose-Capillary: 126 mg/dL — ABNORMAL HIGH (ref 70–99)
Glucose-Capillary: 62 mg/dL — ABNORMAL LOW (ref 70–99)
Glucose-Capillary: 79 mg/dL (ref 70–99)
Glucose-Capillary: 98 mg/dL (ref 70–99)

## 2023-10-11 LAB — CK: Total CK: 76 U/L (ref 49–397)

## 2023-10-11 LAB — URINALYSIS, ROUTINE W REFLEX MICROSCOPIC
Bilirubin Urine: NEGATIVE
Glucose, UA: NEGATIVE mg/dL
Hgb urine dipstick: NEGATIVE
Ketones, ur: NEGATIVE mg/dL
Leukocytes,Ua: NEGATIVE
Nitrite: NEGATIVE
Protein, ur: 100 mg/dL — AB
Specific Gravity, Urine: 1.03 (ref 1.005–1.030)
pH: 5 (ref 5.0–8.0)

## 2023-10-11 LAB — COMPREHENSIVE METABOLIC PANEL WITH GFR
ALT: 14 U/L (ref 0–44)
AST: 41 U/L (ref 15–41)
Albumin: 1.6 g/dL — ABNORMAL LOW (ref 3.5–5.0)
Alkaline Phosphatase: 293 U/L — ABNORMAL HIGH (ref 38–126)
Anion gap: 6 (ref 5–15)
BUN: 6 mg/dL (ref 6–20)
CO2: 33 mmol/L — ABNORMAL HIGH (ref 22–32)
Calcium: 6.9 mg/dL — ABNORMAL LOW (ref 8.9–10.3)
Chloride: 96 mmol/L — ABNORMAL LOW (ref 98–111)
Creatinine, Ser: 0.75 mg/dL (ref 0.61–1.24)
GFR, Estimated: >60 mL/min (ref >60)
Glucose, Bld: 60 mg/dL — ABNORMAL LOW (ref 70–99)
Potassium: 4.1 mmol/L (ref 3.5–5.1)
Sodium: 135 mmol/L (ref 135–145)
Total Bilirubin: 0.8 mg/dL (ref 0.0–1.2)
Total Protein: 7.4 g/dL (ref 6.5–8.1)

## 2023-10-11 LAB — ECHOCARDIOGRAM COMPLETE
AR max vel: 3.99 cm2
AV Peak grad: 3.5 mmHg
Ao pk vel: 0.93 m/s
Area-P 1/2: 4.49 cm2
Height: 72 in
S' Lateral: 2.7 cm
Weight: 2077.62 [oz_av]

## 2023-10-11 LAB — RESP PANEL BY RT-PCR (RSV, FLU A&B, COVID)  RVPGX2
Influenza A by PCR: NEGATIVE
Influenza B by PCR: NEGATIVE
Resp Syncytial Virus by PCR: NEGATIVE
SARS Coronavirus 2 by RT PCR: NEGATIVE

## 2023-10-11 LAB — PHOSPHORUS: Phosphorus: 5.6 mg/dL — ABNORMAL HIGH (ref 2.5–4.6)

## 2023-10-11 LAB — IMMATURE PLATELET FRACTION: Immature Platelet Fraction: 16.5 % — ABNORMAL HIGH (ref 1.2–8.6)

## 2023-10-11 LAB — MAGNESIUM: Magnesium: 1.2 mg/dL — ABNORMAL LOW (ref 1.7–2.4)

## 2023-10-11 LAB — MRSA NEXT GEN BY PCR, NASAL: MRSA by PCR Next Gen: NOT DETECTED

## 2023-10-11 LAB — VITAMIN B12: Vitamin B-12: 427 pg/mL (ref 180–914)

## 2023-10-11 LAB — FOLATE: Folate: 8.3 ng/mL (ref >5.9)

## 2023-10-11 LAB — TSH: TSH: 0.661 u[IU]/mL (ref 0.350–4.500)

## 2023-10-11 MED ORDER — METHYLPREDNISOLONE SODIUM SUCC 40 MG IJ SOLR
32.0000 mg | Freq: Every day | INTRAMUSCULAR | Status: DC
  Administered 2023-10-11 – 2023-10-12 (×2): 32 mg via INTRAVENOUS
  Filled 2023-10-11 (×2): qty 1

## 2023-10-11 MED ORDER — OXYCODONE HCL 5 MG PO TABS: 5.0000 mg | ORAL_TABLET | ORAL | Status: DC | PRN

## 2023-10-11 MED ORDER — SODIUM CHLORIDE 0.9 % IV BOLUS
500.0000 mL | Freq: Once | INTRAVENOUS | Status: AC
  Administered 2023-10-11: 500 mL via INTRAVENOUS

## 2023-10-11 MED ORDER — SODIUM CHLORIDE 0.9 % IV SOLN
100.0000 mg | Freq: Two times a day (BID) | INTRAVENOUS | Status: DC
  Administered 2023-10-11 – 2023-10-12 (×3): 100 mg via INTRAVENOUS
  Filled 2023-10-11 (×3): qty 100

## 2023-10-11 MED ORDER — ALBUMIN HUMAN 25 % IV SOLN
25.0000 g | Freq: Four times a day (QID) | INTRAVENOUS | Status: AC
  Administered 2023-10-11: 25 g via INTRAVENOUS
  Administered 2023-10-11 (×2): 12.5 g via INTRAVENOUS
  Administered 2023-10-12: 25 g via INTRAVENOUS
  Filled 2023-10-11 (×5): qty 100

## 2023-10-11 MED ORDER — SODIUM CHLORIDE 0.9% FLUSH
10.0000 mL | Freq: Two times a day (BID) | INTRAVENOUS | Status: DC
  Administered 2023-10-11 – 2023-10-12 (×3): 10 mL

## 2023-10-11 MED ORDER — SULFAMETHOXAZOLE-TRIMETHOPRIM 800-160 MG PO TABS
2.0000 | ORAL_TABLET | Freq: Three times a day (TID) | ORAL | Status: DC
  Filled 2023-10-11 (×3): qty 2

## 2023-10-11 MED ORDER — DEXTROSE 50 % IV SOLN
12.5000 g | Freq: Once | INTRAVENOUS | Status: AC
  Administered 2023-10-11: 12.5 g via INTRAVENOUS
  Filled 2023-10-11: qty 50

## 2023-10-11 MED ORDER — CHLORHEXIDINE GLUCONATE CLOTH 2 % EX PADS
6.0000 | MEDICATED_PAD | Freq: Every day | CUTANEOUS | Status: DC
  Administered 2023-10-11: 6 via TOPICAL

## 2023-10-11 MED ORDER — PREDNISONE 10 MG PO TABS: 40.0000 mg | ORAL_TABLET | Freq: Every day | ORAL | Status: DC

## 2023-10-11 MED ORDER — HYDROMORPHONE HCL 1 MG/ML IJ SOLN
INTRAMUSCULAR | Status: AC
  Administered 2023-10-11: 1 mg via INTRAVENOUS
  Filled 2023-10-11: qty 1

## 2023-10-11 MED ORDER — HYDROMORPHONE HCL 1 MG/ML IJ SOLN: 1.0000 mg | Freq: Once | INTRAMUSCULAR | Status: AC

## 2023-10-11 MED ORDER — HYDROMORPHONE HCL 1 MG/ML IJ SOLN
0.5000 mg | INTRAMUSCULAR | Status: DC | PRN
  Administered 2023-10-12 – 2023-10-13 (×8): 0.5 mg via INTRAVENOUS
  Filled 2023-10-11 (×8): qty 1

## 2023-10-11 MED ORDER — MAGNESIUM SULFATE 4 GM/100ML IV SOLN
4.0000 g | Freq: Once | INTRAVENOUS | Status: AC
  Administered 2023-10-11: 4 g via INTRAVENOUS
  Filled 2023-10-11: qty 100

## 2023-10-11 MED ORDER — HYDROMORPHONE HCL 1 MG/ML IJ SOLN: 0.5000 mg | INTRAMUSCULAR | Status: DC | PRN

## 2023-10-11 MED ORDER — SODIUM CHLORIDE 0.9% FLUSH: 10.0000 mL | INTRAVENOUS | Status: DC | PRN

## 2023-10-11 MED ORDER — FENTANYL 12 MCG/HR TD PT72
1.0000 | MEDICATED_PATCH | TRANSDERMAL | Status: DC
  Administered 2023-10-11: 1 via TRANSDERMAL
  Filled 2023-10-11: qty 1

## 2023-10-11 MED ORDER — OXYCODONE HCL 5 MG PO TABS: 10.0000 mg | ORAL_TABLET | ORAL | Status: DC | PRN

## 2023-10-11 MED ORDER — ALBUMIN HUMAN 25 % IV SOLN
25.0000 g | Freq: Once | INTRAVENOUS | Status: AC
  Administered 2023-10-11: 25 g via INTRAVENOUS
  Filled 2023-10-11: qty 100

## 2023-10-11 MED ORDER — MAGNESIUM SULFATE 2 GM/50ML IV SOLN
2.0000 g | Freq: Once | INTRAVENOUS | Status: AC
  Administered 2023-10-11: 2 g via INTRAVENOUS
  Filled 2023-10-11: qty 50

## 2023-10-11 NOTE — Significant Event (Signed)
       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 Poor follow up with cancer tx secondary to insurance issues Patient to ED 9/4 with tachypnea, hypglucemia, elevated AST, hypoalbuminemia, worsening thrombocytopenia, proteinuria, severe protein calore malnutrition.  CT abdoment/pelvis 1. No renal stones or obstructive uropathy.2. Suggestion of gastric wall thickening, can be seen with gastritis.3. Innumerable lucent lesions throughout the skeleton, unchanged from prior.4. Grossly stable hypodense liver lesions, suboptimally assessed in the absence of IV contrast.5. Small amount of free fluid in the pelvis, improved from prior. 6. Mild generalized body wall edema. Infiltrative changes in the subcutaneous tissues of the upper thigh, without significant change. CTA chest IMPRESSION:1. No pulmonary embolus. 2. Bilateral pleural effusions and thickening, stable from prior exam. 3. Chronic scarring and bronchiectasis with upper lobe predominant subpleural cystic change, stable widespread bilateral peribronchovascular ill-defined nodules and nodularity again seen. 4. Progressive areas of ground-glass in the lower lobes from prior exam, may be infectious or inflammatory. 5. Increasing subcarinal node or mass causing mass effect on the left atrium. Increasing soft tissue thickening in the anterior mediastinum and prevascular space, may be neoplastic or inflammatory. 6.  Again seen innumerable lytic lesions throughout the skeleton. 7. Dilated main pulmonary artery at 3.8 cm, can be seen with pulmonary arterial hypertension.  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 With poor physical and nutritional state with metastatic disease he has very poor prognosis and recommended palliative services.    Interventions   Assessment/Plan:     10/11/2023    4:21 AM 10/11/2023    4:18 AM 10/11/2023    4:08 AM  Vitals with BMI  Systolic 99 76 72  Diastolic 42 48 41  Pulse   69   TEMP                     97.4 CBG 62 Patient alert, having some chills, oriented, productive cough with thick sputums yellow brown poss blood tinged   Ns 500 ml x 1 Albumin  25 gm now and every 6h for 4 doses 1/2 amp d50  Increase D5LR to 125 ml/h Consult IV team for PICC - discussed with patient ABG CK and TSH added to am labs Mag 1.2 - 6 gm IV ordered Corrected calcium  8.8  Phosphorous high at 5.6 - will need monitored in setting of bone metastasis Continue with planned palliative service consult       Warren LITTIE Cone NP [facility] Cross Cover 7pm-7am - check amion for availability Pager [phone]

## 2023-10-11 NOTE — H&P (Signed)
 NAME:  Warren Farrell, MRN:  [id number], DOB:  1990-04-20, LOS: 2 ADMISSION DATE:  10/09/2023, CONSULTATION DATE:  10/11/23 REFERRING MD:  Laneta Marsa COME CHIEF COMPLAINT:  Sarcoma   History of Present Illness:  Warren Farrell is a 33 y.o. 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 Problems with insurance, has not been able to follow w/ oncology / ID, now moved with sister in [place]. Presented to ED on 10/09/23 from home for hypoglycemia, chest pain, back pain, hematuria, emesis.   Pertinent  Medical History  As above  Significant Hospital Events: Including procedures, antibiotic start and stop dates in addition to other pertinent events   9/6 Transfer to ICU  Interim History / Subjective:    Objective    Blood pressure (!) 99/52, pulse (!) 104, temperature 98 F (36.7 C), temperature source Oral, resp. rate 10, height 6' (1.829 m), weight 58.9 kg, SpO2 97%.        Intake/Output Summary (Last 24 hours) at 10/11/2023 1756 Last data filed at 10/11/2023 1700 Gross per 24 hour  Intake 3389.11 ml  Output --  Net 3389.11 ml   Filed Weights   10/09/23 1353 10/11/23 1301  Weight: 51.3 kg 58.9 kg    Examination: General: cachexia, somnolence HENT: Nodules over neck, PICC in place, poor dentition Lungs: scattered crackles bilateral, poor respiratory effort Cardiovascular: RRR, no murmur Abdomen: flat, soft, BS present Extremities: no edema, pulses palpable Neuro: A&O x 4, follows commands GU: incontinence  Resolved problem list  N/a  Assessment and Plan   # Lung disease # Probable atypical CAP + PCP PNA # SOB - CT 9/4 with mostly stable chronic findings except for evolving lower lung GGO suggesting atypical PNA and increasing subcarinal and anterior mediastinal masses, no PE - solumedrol daily x 5 days  (equivalent oral: 40 mg) - doxycycline  bid x 7 days for atypical coverage (9/6) - DS bactrim  q8h (9/4) - wean oxygen for sats >91%, currently on 4L [place] (baseline RA)  # Malnutrition, protein/calorie # Hypoglyemia - multivitamin + folic acid  + thiamine  daily - marinol  bid with meals, hold for excessive sedation - albumin  25% total 125g (9/6) - D5LR at 125 ml/hr  - Nutrition consult  # Tachycardia - metoprolol  12.5 mg bid with parameters  # Renal disease # Hematuria Renal u/s confirming medical renal disease - monitor I&Os, electrolytes - IVF  # Karposi Sarcoma with visceral and bone metastasis, possible lung/mediastinal involvement # AIDS - continue combination antiretroviral therapy (Biktarvy ) - followed by Oncology, recommending establishing [facility] now that residing in [place] - repeat CD4 count 9/7 AM  # Pain - palliative following, appreciate recommendations regarding GOC (transitioned to DNAR status) and pain mgmt - fentanyl  12.5 mg q3day, lidoderm , prn dilaudid  q2h for breakthrough pain - continue tid gabapentin   # Depression - buspar  5 mg tid - zoloft  25 mg daily - effexor  XR 37.5 mg daily - encourage sleep wake cycle with trazodone  75 mg qhs  # Thrombocytopenia 83 -> 67 -> 41k as of 9/6 - elavated immature plt count (16.5) on 9/5 - holding VTE ppx, ibuprofen   Labs   CBC: Recent Labs  Lab 10/09/23 1347 10/09/23 1939 10/10/23 0417 10/11/23 0453  WBC 5.0 4.6 3.5* 6.9  NEUTROABS 2.9  --   --  5.6  HGB 10.4*  10.4* 10.7* 10.2*  HCT 33.0* 33.3* 34.7* 34.1*  MCV 96.8 98.5 98.6 104.3*  PLT 79* 83* 67* 41*    Basic Metabolic Panel: Recent Labs  Lab 10/09/23 1347 10/09/23 1939 10/10/23 0417 10/11/23 0453  NA 137  --  136 135  K 3.4*  --  3.4* 4.1  CL 98  --  98 96*  CO2 28  --  34* 33*  GLUCOSE 62*  --  114* 60*  BUN 11  --  8 6  CREATININE 0.46* 0.56* 0.45* 0.75  CALCIUM  6.9*  --  6.8* 6.9*  MG  --   --   --   1.2*  PHOS  --   --   --  5.6*   GFR: Estimated Creatinine Clearance: 108.4 mL/min (by C-G formula based on SCr of 0.75 mg/dL). Recent Labs  Lab 10/09/23 1347 10/09/23 1939 10/10/23 0417 10/11/23 0453  WBC 5.0 4.6 3.5* 6.9    Liver Function Tests: Recent Labs  Lab 10/09/23 1347 10/10/23 0417 10/11/23 0453  AST 141* 64* 41  ALT 23 18 14   ALKPHOS 495* 433* 293*  BILITOT 1.8* 1.1 0.8  PROT 8.5* 8.2* 7.4  ALBUMIN  1.9* 1.8* 1.6*   No results for input(s): LIPASE, AMYLASE in the last 168 hours. No results for input(s): AMMONIA in the last 168 hours.  ABG    Component Value Date/Time   PHART 7.36 10/11/2023 0445   PCO2ART 65 (H) 10/11/2023 0445   PO2ART 100 10/11/2023 0445   HCO3 36.7 (H) 10/11/2023 0445   O2SAT 99.6 10/11/2023 0445     Coagulation Profile: No results for input(s): INR, PROTIME in the last 168 hours.  Cardiac Enzymes: Recent Labs  Lab 10/11/23 0453  CKTOTAL 76    HbA1C: No results found for: HGBA1C  CBG: Recent Labs  Lab 10/11/23 0038 10/11/23 0440 10/11/23 0543 10/11/23 1258 10/11/23 1601  GLUCAP 124* 62* 79 98 126*    Review of Systems:   Generalized severe pain  Past Medical History:  He,  has a past medical history of Cancer (HCC).   Surgical History:  History reviewed. No pertinent surgical history.   Social History:   reports that he has never smoked. He has never used smokeless tobacco.   Family History:  His family history is not on file.   Allergies No Known Allergies   Home Medications  Prior to Admission medications   Medication Sig Start Date End Date Taking? Authorizing Provider  bictegravir-emtricitabine -tenofovir  AF (BIKTARVY ) 50-200-25 MG TABS tablet Take 1 tablet by mouth daily. 12/04/16  Yes [provider]  busPIRone  (BUSPAR ) 5 MG tablet Take 5 mg by mouth 3 (three) times daily. (Morning, noon, and bedtime) 07/22/23  Yes [provider]  calcium  carbonate (TUMS EX) 750 MG  chewable tablet Chew 2 tablets by mouth daily.   Yes [provider]  Cholecalciferol  (VITAMIN D -1000 MAX ST) 25 MCG (1000 UT) tablet Take 2,000 Units by mouth daily. 07/23/23  Yes [provider]  dronabinol  (MARINOL ) 5 MG capsule Take 5 mg by mouth 2 (two) times daily with a meal. 07/22/23  Yes [provider]  famotidine  (PEPCID ) 10 MG tablet Take 10 mg by mouth 2 (two) times daily. 07/22/23  Yes [provider]  folic acid  (FOLVITE ) 1 MG tablet Take 1 mg by mouth daily. 12/04/16  Yes [provider]  gabapentin  (NEURONTIN ) 300 MG capsule Take 600 mg by mouth 3 (three) times daily. (Morning, noon, and bedtime) 07/22/23  Yes  [provider]  ibuprofen  (ADVIL ) 400 MG tablet Take 400 mg by mouth every 6 (six) hours as needed.   Yes [provider]  lidocaine  (LIDODERM ) 5 % Place 1 patch onto the skin daily. Remove & Discard patch within 12 hours or as directed by MD   Yes [provider]  magnesium  oxide (MAG-OX) 400 (240 Mg) MG tablet Take 400 mg by mouth daily.   Yes [provider]  methocarbamol  (ROBAXIN ) 500 MG tablet Take 500 mg by mouth 4 (four) times daily as needed for muscle spasms. 07/22/23  Yes [provider]  metoprolol  tartrate (LOPRESSOR ) 25 MG tablet Take 12.5 mg by mouth 2 (two) times daily. 07/22/23  Yes [provider]  Multiple Vitamin (QUINTABS) TABS Take 1 tablet by mouth. 07/22/23  Yes [provider]  naloxone  (NARCAN ) nasal spray 4 mg/0.1 mL Administer a single spray into one nostril, call 911. May repeat in 2 to 3 minutes with a new spray in the other nostril.. 07/22/23  Yes [provider]  ondansetron  (ZOFRAN -ODT) 4 MG disintegrating tablet Take 4 mg by mouth every 4 (four) hours as needed for nausea or vomiting.   Yes [provider]  oxyCODONE  (OXY IR/ROXICODONE ) 5 MG immediate release tablet Take 5 mg by mouth every 6 (six) hours as needed for severe  pain.   Yes [provider]  potassium chloride  SA (KLOR-CON  M) 20 MEQ tablet Take 20 mEq by mouth once.   Yes [provider]  sertraline  (ZOLOFT ) 25 MG tablet Take 1 tablet (25 mg total) by mouth daily. Hold until you see your doctor 05/09/22  Yes Caleen Qualia, MD  sulfamethoxazole -trimethoprim  (BACTRIM  DS) 800-160 MG tablet Take 1 tablet by mouth 3 (three) times a week. (Monday, Wednesday, Friday) 01/03/17  Yes [provider]  thiamine  (VITAMIN B1) 100 MG tablet Take 100 mg by mouth daily. 07/22/23  Yes [provider]  traZODone  (DESYREL ) 150 MG tablet Take 75 mg by mouth at bedtime. 07/22/23  Yes [provider]  Oxycodone  HCl 10 MG TABS Take 10 mg by mouth every 6 (six) hours as needed (pain). 07/22/23   [provider]  pantoprazole  (PROTONIX ) 40 MG tablet Take 40 mg by mouth 2 (two) times daily. Patient not taking: Reported on 10/09/2023 12/04/16   [provider]  venlafaxine  XR (EFFEXOR -XR) 37.5 MG 24 hr capsule Take 37.5 mg by mouth daily with breakfast. 07/23/23   [provider]     Critical care time: 55 min    Haifa Hatton, ACNP-BC Pulmonary Critical Care, [place] Phone: [phone]

## 2023-10-11 NOTE — Plan of Care (Signed)

## 2023-10-11 NOTE — Progress Notes (Signed)
   10/11/23 0400  Spiritual Encounters  Type of Visit Initial  Care provided to: Pt not available  Conversation partners present during encounter Nurse  Referral source Code page  Reason for visit Code  OnCall Visit Yes   Chaplain responded to rapid page. Patient with care team. Chaplain checked in with care team. No needs present at this time. No family present at this time. Chaplain is available for follow up as needed.

## 2023-10-11 NOTE — Progress Notes (Signed)
  Chaplain On-Call responded to a page from the ICU Secretary, who reported that the patient's Nurse has requested the Chaplain for support of the patient.  Chaplain met RN Burnard and received a medical update about the patient's grave condition. Burnard also stated that while the patient does offer conversation occasionally, the patient is non-verbal at this time.  Chaplain spoke to the patient, who did not respond. Chaplain sat at the bedside of the patient; touched his hand for several minutes, and provided prayer.  Chaplain Bebe Ardean EMERSON Hershal., [facility]

## 2023-10-11 NOTE — Progress Notes (Signed)
 Spoke with the primary care RN for clarification on consult. Stated pt needs a PICC placement. Currently with a g 20 piv to the L upper arm infusing fluids. RN stated IV ok for now but needs a more stable access.Spoke with pt who stated that he has had cvc/picc access before. Picc has been ordered. Noted current piv is in use at this time.

## 2023-10-11 NOTE — Plan of Care (Signed)
 Continuing with plan of care.

## 2023-10-11 NOTE — Progress Notes (Signed)
 Peripherally Inserted Central Catheter Placement  The IV Nurse has discussed with the patient and/or persons authorized to consent for the patient, the purpose of this procedure and the potential benefits and risks involved with this procedure.  The benefits include less needle sticks, lab draws from the catheter, and the patient may be discharged home with the catheter. Risks include, but not limited to, infection, bleeding, blood clot (thrombus formation), and puncture of an artery; nerve damage and irregular heartbeat and possibility to perform a PICC exchange if needed/ordered by physician.  Alternatives to this procedure were also discussed.  Bard Power PICC patient education guide, fact sheet on infection prevention and patient information card has been provided to patient /or left at bedside.    PICC Placement Documentation  PICC Double Lumen 10/11/23 Right Brachial 37 cm 2 cm (Active)  Indication for Insertion or Continuance of Line Poor Vasculature-patient has had multiple peripheral attempts or PIVs lasting less than 24 hours 10/11/23 1158  Exposed Catheter (cm) 2 cm 10/11/23 1158  Site Assessment Clean, Dry, Intact 10/11/23 1158  Lumen #1 Status Flushed;Saline locked;Blood return noted 10/11/23 1158  Lumen #2 Status Flushed;Saline locked;Blood return noted 10/11/23 1158  Dressing Type Transparent;Securing device 10/11/23 1158  Dressing Status Antimicrobial disc/dressing in place;Clean, Dry, Intact 10/11/23 1158  Line Care Connections checked and tightened 10/11/23 1158  Line Adjustment (NICU/IV Team Only) No 10/11/23 1158  Dressing Intervention New dressing;Adhesive placed at insertion site (IV team only);Adhesive placed around edges of dressing (IV team/ICU RN only) 10/11/23 1158  Dressing Change Due 10/18/23 10/11/23 1158       Warren Farrell 10/11/2023, 11:59 AM

## 2023-10-11 NOTE — Progress Notes (Addendum)
 PROGRESS NOTE    Warren Farrell   FMW:[id number] DOB: Apr 03, 1990  DOA: 10/09/2023 Date of Service: 10/11/23 which is hospital day 2  PCP: Pcp, No    Hospital course / significant events:   HPI: Warren Farrell is a 33 y.o. 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 Problems with insurance, has not been able to follow w/ oncology / ID. Today 10/09/23 presents to ED [facility] from home for hypoglycemia, chest pain, back pain, hematuria, emesis. Cbg 52, received tube of glucose w/ EMS, improvement to 56.    09/04: to ED. VSS except mild tachypnea to max 26, 100% SpO2 on his baseline 2L [place], Glc 62, hypocalcemia 6.9, ALP elevated 495, hypoalbuminemia 1.9, mild transaminitis AST 141, GFR >60, Hgb 10.4 about baseline, thrombocytopenia Plt 79, UA (+)proteinuria >300, 0-5 RBC/hpf. Troponin neg/flat. Chronic heterogeneous opacities bilaterally with slightly increased patchy opacities in the right lower lung and left mid lung, may represent acute infection/inflammation. Renal US  concerning for medical renal disease. Hospitalist asked to admit. I spoke w/ Dr Babara oncology to confirm that cancer tx would be outpatient anyway so no need for ED to ED transfer to [facility]. Admission for pain control, renal w/u, eval for PE/PNA w/ chest CT, eval abd pain / gross metastasis w/ CT abd/pelv. Pt requested palliative care discussion, full code at this time  09/05: palliative and oncology consults --> pt would like to pursue tx, goal to get him set up w/ [facility]. Poor overall prognosis, desires FULL CODE 09/06: hypotension / hypothermia overnight. Some imrpved w/ bair hugger, IV fluids, albumin . Pending PICC access to run albumin , mg, etc. PCCU consult, suspect may need pressors. I d/w patient re: if BP continues to  drop and his organs start failing, we will not be able to help him with CPR. Attempt w/ pt's permission to call his sister, no answer / no voicemail set up. Palliative care d/w patient further today, decision made for DNR    Consultants:  Palliative care  Medical oncology   Procedures/Surgeries: None       ASSESSMENT & PLAN:   Kaposi sarcoma w/ associated metastatic disease including lungs, spine, sternum which is causing chest/back pain  HIV/AIDS - see below Per oncology: He has received at least 2 lines of systemic chemotherapy, as well as local therapy with hepatic  embolization.  Unclear if patient has had immunotherapy. Currently liver lesion appears to be stable in size. He has very poor performance status and poor nutrition status. Prognosis is poor. Discussed with him about hospice option. Patient desires to explore further cancer treatments.  Opiate pain control - anticipate transition to po pain control over the weekend to facilitate discharge - advised pt that opiates can also lower BP. If pain is untenable w/ lower dose and goal transitions to pain control primary priority and organ function / survival secondary priority, would not do CPR. He voices understanding, would like to prioritize survival over pain control at this time.  recommend patient to establish care with [facility].  Our cancer center will coordinate for patient upon discharge  CT Chest concerning for metastatic involvement of cardiac tissue - clinically significant Echo  Hypotension Hypothermia Concern for severe SIRS/Sepsis May need escalate abx but I am not convinced there is tru pneumonia present PCCU consult  Continue PCP dose bactrim  for now  COVID/Flu/RSV Fluids, albumin   Very poor reserves and prognosis is poor   HIV/AIDS  establish with infectious disease.   Continue antiretroviral therapy  Check CD4 counts  Question PCP pneumonia CT chest concerning for progressive  ground-glass opacity Bactrim  PCP dose tid Expectorated sputum for PCP PCR    Acute on Chronic hypoxic respiratory failure Continue O2, titrate up as needed / wean as able   Hypoglycemia Poor po intake  D5LR fluids Monitor Glc    Hypocalcemia  Baseline Monitor    ALP elevated 495 --> trend down 293 mild transaminitis AST 141 --> WNL Liver involvement / met disease Suspect increased w/ inflammatory condition cancer / bone involvement and can monitor    hypoalbuminemia 1.9 --> 1.6 Severe malnutrition  supplementing albumin  given hypotension   Chronic normocytic anemia Hgb 10.4 about baseline Monitor CBC Hematology/oncology to follow   thrombocytopenia Plt 83 --> 41 Hold anticoag/DVT Ppx unless (+)PE check B12, folate, immature platelet count    UA (+)proteinuria >300 --> 100 Pt reports gross hematuria however on UA here 0-5 RBC/hpf.  Renal US  c/w medical renal disease  Monitor / recheck as needed     underweight based on BMI: Body mass index is 15.33 kg/m.SABRA Significantly low or high BMI is associated with higher medical risk.  Underweight - under 18  overweight - 25 to 29 obese - 30 or more Class 1 obesity: BMI of 30.0 to 34 Class 2 obesity: BMI of 35.0 to 39 Class 3 obesity: BMI of 40.0 to 49 Super Morbid Obesity: BMI 50-59 Super-super Morbid Obesity: BMI 60+ Healthy nutrition and physical activity advised as adjunct to other disease management and risk reduction treatments    DVT prophylaxis: SCD IV fluids: D5LR continuous IV fluids  Nutrition: regular diet as tolerated  Central lines / other devices: none  Code Status: FULL CODE ACP documentation reviewed:  none on file in [facility]  TOC needs: home health Medical barriers to dispo: pain control, onc consult. Expected medical readiness for discharge couple days as wean off IV pain meds but may be longer if hypotensive / possible need transition to hospice pending clinical course               Subjective / Brief ROS:  Patient reports pain is controlled this morning Denies CP/SOB at rest Denies focal weakness reports feeling generally very weak/tired  Tolerating diet but not really eating.    Family Communication: called sister, no voicemail set up    Objective Findings:  Vitals:   10/11/23 1400 10/11/23 1430 10/11/23 1500 10/11/23 1530  BP:  (!) 91/47 (!) 101/54 (!) 100/59  Pulse: (!) 104 (!) 110 (!) 110 (!) 106  Resp: 17 11 10  (!) 9  Temp:      TempSrc:      SpO2: 99% 99% 99% 99%  Weight:      Height:        Intake/Output Summary (Last 24 hours) at 10/11/2023 1543 Last data filed at 10/10/2023 1812 Gross per 24 hour  Intake 798.84 ml  Output --  Net 798.84 ml   Filed Weights   10/09/23 1353 10/11/23 1301  Weight: 51.3 kg 58.9 kg    Examination:  Physical Exam Constitutional:      General: He is not in acute distress.    Appearance: He is ill-appearing.  Cardiovascular:     Rate and Rhythm: Regular rhythm. Tachycardia present.  Pulmonary:     Effort: Pulmonary effort  is normal.     Breath sounds: Normal breath sounds.  Abdominal:     Palpations: Abdomen is soft.  Skin:    General: Skin is dry.     Findings: Lesion and rash present.  Neurological:     Mental Status: He is alert and oriented to person, place, and time. Mental status is at baseline.  Psychiatric:        Mood and Affect: Mood normal.        Behavior: Behavior normal.          Scheduled Medications:   bictegravir-emtricitabine -tenofovir  AF  1 tablet Oral Daily   busPIRone   5 mg Oral TID   Chlorhexidine  Gluconate Cloth  6 each Topical Daily   cholecalciferol   2,000 Units Oral Daily   dronabinol   5 mg Oral BID WC   famotidine   10 mg Oral BID   feeding supplement  237 mL Oral BID BM   fentaNYL   1 patch Transdermal Q72H   folic acid   1 mg Oral Daily   gabapentin   600 mg Oral TID   lidocaine   1-2 patch Transdermal Q24H   magnesium  oxide  400 mg Oral  Daily   methylPREDNISolone  (SOLU-MEDROL ) injection  32 mg Intravenous Daily   metoprolol  tartrate  12.5 mg Oral BID   multivitamin with minerals  1 tablet Oral Daily   sertraline   25 mg Oral Daily   sodium chloride  flush  10-40 mL Intracatheter Q12H   thiamine   100 mg Oral Daily   traZODone   75 mg Oral QHS   venlafaxine  XR  37.5 mg Oral Q breakfast    Continuous Infusions:  albumin  human 25 g (10/11/23 1207)   dextrose  5% lactated ringers  125 mL/hr at 10/11/23 0558   doxycycline  (VIBRAMYCIN ) IV 100 mg (10/11/23 1422)    PRN Medications:  calcium  carbonate, diphenhydrAMINE , HYDROmorphone  (DILAUDID ) injection, ibuprofen , ondansetron  **OR** ondansetron  (ZOFRAN ) IV, oxyCODONE  **OR** oxyCODONE , sodium chloride  flush  Antimicrobials from admission:  Anti-infectives (From admission, onward)    Start     Dose/Rate Route Frequency Ordered Stop   10/11/23 1500  doxycycline  (VIBRAMYCIN ) 100 mg in sodium chloride  0.9 % 250 mL IVPB        100 mg 125 mL/hr over 120 Minutes Intravenous Every 12 hours 10/11/23 1401 10/18/23 0959   10/10/23 1000  bictegravir-emtricitabine -tenofovir  AF (BIKTARVY ) 50-200-25 MG per tablet 1 tablet        1 tablet Oral Daily 10/09/23 1708     10/10/23 0900  sulfamethoxazole -trimethoprim  (BACTRIM  DS) 800-160 MG per tablet 1 tablet  Status:  Discontinued       Note to Pharmacy: (Monday, Wednesday, Friday)     1 tablet Oral Once per day on Monday Wednesday Friday 10/09/23 1708 10/09/23 1841   10/09/23 2200  sulfamethoxazole -trimethoprim  (BACTRIM  DS) 800-160 MG per tablet 2 tablet  Status:  Discontinued       Note to Pharmacy: (Monday, Wednesday, Friday)     2 tablet Oral Every 8 hours 10/09/23 1841 10/11/23 1401           Data Reviewed:  I have personally reviewed the following...  CBC: Recent Labs  Lab 10/09/23 1347 10/09/23 1939 10/10/23 0417 10/11/23 0453  WBC 5.0 4.6 3.5* 6.9  NEUTROABS 2.9  --   --  5.6  HGB 10.4* 10.4* 10.7* 10.2*  HCT 33.0*  33.3* 34.7* 34.1*  MCV 96.8 98.5 98.6 104.3*  PLT 79* 83* 67* 41*   Basic Metabolic Panel: Recent Labs  Lab 10/09/23 1347 10/09/23 1939  10/10/23 0417 10/11/23 0453  NA 137  --  136 135  K 3.4*  --  3.4* 4.1  CL 98  --  98 96*  CO2 28  --  34* 33*  GLUCOSE 62*  --  114* 60*  BUN 11  --  8 6  CREATININE 0.46* 0.56* 0.45* 0.75  CALCIUM  6.9*  --  6.8* 6.9*  MG  --   --   --  1.2*  PHOS  --   --   --  5.6*   GFR: Estimated Creatinine Clearance: 108.4 mL/min (by C-G formula based on SCr of 0.75 mg/dL). Liver Function Tests: Recent Labs  Lab 10/09/23 1347 10/10/23 0417 10/11/23 0453  AST 141* 64* 41  ALT 23 18 14   ALKPHOS 495* 433* 293*  BILITOT 1.8* 1.1 0.8  PROT 8.5* 8.2* 7.4  ALBUMIN  1.9* 1.8* 1.6*   No results for input(s): LIPASE, AMYLASE in the last 168 hours. No results for input(s): AMMONIA in the last 168 hours. Coagulation Profile: No results for input(s): INR, PROTIME in the last 168 hours. Cardiac Enzymes: Recent Labs  Lab 10/11/23 0453  CKTOTAL 76   BNP (last 3 results) No results for input(s): PROBNP in the last 8760 hours. HbA1C: No results for input(s): HGBA1C in the last 72 hours. CBG: Recent Labs  Lab 10/10/23 0149 10/11/23 0038 10/11/23 0440 10/11/23 0543 10/11/23 1258  GLUCAP 122* 124* 62* 79 98   Lipid Profile: No results for input(s): CHOL, HDL, LDLCALC, TRIG, CHOLHDL, LDLDIRECT in the last 72 hours. Thyroid Function Tests: Recent Labs    10/11/23 0453  TSH 0.661   Anemia Panel: Recent Labs    10/11/23 0453  VITAMINB12 427  FOLATE 8.3   Most Recent Urinalysis On File:     Component Value Date/Time   COLORURINE AMBER (A) 10/11/2023 0620   APPEARANCEUR CLEAR (A) 10/11/2023 0620   APPEARANCEUR Clear 07/13/2013 0923   LABSPEC 1.030 10/11/2023 0620   LABSPEC 1.015 07/13/2013 0923   PHURINE 5.0 10/11/2023 0620   GLUCOSEU NEGATIVE 10/11/2023 0620   GLUCOSEU Negative 07/13/2013 0923   HGBUR  NEGATIVE 10/11/2023 0620   BILIRUBINUR NEGATIVE 10/11/2023 0620   BILIRUBINUR Negative 07/13/2013 0923   KETONESUR NEGATIVE 10/11/2023 0620   PROTEINUR 100 (A) 10/11/2023 0620   NITRITE NEGATIVE 10/11/2023 0620   LEUKOCYTESUR NEGATIVE 10/11/2023 0620   LEUKOCYTESUR Negative 07/13/2013 0923   Sepsis Labs: @LABRCNTIP (procalcitonin:4,lacticidven:4) Microbiology: Recent Results (from the past 240 hours)  MRSA Next Gen by PCR, Nasal     Status: None   Collection Time: 10/11/23  1:04 PM   Specimen: Nasal Mucosa; Nasal Swab  Result Value Ref Range Status   MRSA by PCR Next Gen NOT DETECTED NOT DETECTED Final    Comment: (NOTE) The GeneXpert MRSA Assay (FDA approved for NASAL specimens only), is one component of a comprehensive MRSA colonization surveillance program. It is not intended to diagnose MRSA infection nor to guide or monitor treatment for MRSA infections. Test performance is not FDA approved in patients less than 27 years old. Performed at [facility], [address] [place], [place] [address]       Radiology Studies last 3 days: US  EKG SITE RITE Result Date: 10/11/2023 If Site Rite image not attached, placement could not be confirmed due to current cardiac rhythm.  CT ABDOMEN PELVIS WO CONTRAST Result Date: 10/09/2023 CLINICAL DATA:  Abdominal pain, acute, nonlocalized hematuria, evaluate for stone or other cause, renal US  nonrevealing EXAM: CT ABDOMEN AND PELVIS  WITHOUT CONTRAST TECHNIQUE: Multidetector CT imaging of the abdomen and pelvis was performed following the standard protocol without IV contrast. RADIATION DOSE REDUCTION: This exam was performed according to the departmental dose-optimization program which includes automated exposure control, adjustment of the mA and/or kV according to patient size and/or use of iterative reconstruction technique. COMPARISON:  No ultrasound earlier today. Contrast-enhanced abdominopelvic CT 09/08/2023 FINDINGS: Lower chest:  Assessed on chest CTA performed can concurrently, reported separately. Hepatobiliary: Stable hypodense liver lesions, largest measuring 2.6 cm. Direct comparison is limited in the absence of IV contrast. Gallbladder physiologically distended, no calcified stone. No biliary dilatation. Pancreas: Not well assessed in the absence of contrast and paucity of intra-abdominal fat. No obvious inflammation. Spleen: Calcified splenic granulomas. The splenic lesions on prior are not demonstrated in the absence of contrast. No splenomegaly. Adrenals/Urinary Tract: No adrenal nodule. No hydronephrosis or renal calculi. No evidence of renal mass on this unenhanced exam. Partially distended urinary bladder, improved bladder wall thickening from prior. Stomach/Bowel: Suboptimally assessed in the absence of contrast and paucity of intra-abdominal fat. Suggestion of gastric wall thickening. No small bowel distension or evidence of obstruction. Minimal formed stool in the colon. A few colonic diverticula in the descending and sigmoid. Vascular/Lymphatic: Normal caliber abdominal aorta. No evidence of portal venous or mesenteric gas. Adenopathy assessment is limited in the absence of contrast and paucity of body fat. The soft tissue thickening within the upper retroperitoneum on prior is suboptimally assessed on the current exam. Reproductive: Prostate is unremarkable. Other: Small amount of free fluid in the pelvis, improved from prior. Mild generalized body wall edema. Infiltrative changes in the subcutaneous tissues of the upper thigh 6, without significant change. Musculoskeletal: Innumerable lucent lesions throughout the skeleton. Small lucent lesion in the right femoral neck. No obvious extraosseous soft tissue component. IMPRESSION: 1. No renal stones or obstructive uropathy. 2. Suggestion of gastric wall thickening, can be seen with gastritis. 3. Innumerable lucent lesions throughout the skeleton, unchanged from prior. 4.  Grossly stable hypodense liver lesions, suboptimally assessed in the absence of IV contrast. 5. Small amount of free fluid in the pelvis, improved from prior. 6. Mild generalized body wall edema. Infiltrative changes in the subcutaneous tissues of the upper thigh, without significant change. Electronically Signed   By: Andrea Gasman M.D.   On: 10/09/2023 18:55   CT Angio Chest Pulmonary Embolism (PE) W or WO Contrast Result Date: 10/09/2023 CLINICAL DATA:  Provided history: Cpugh and SOB, known lung complications of kaposi sarcoma, checking for PE EXAM: CT ANGIOGRAPHY CHEST WITH CONTRAST TECHNIQUE: Multidetector CT imaging of the chest was performed using the standard protocol during bolus administration of intravenous contrast. Multiplanar CT image reconstructions and MIPs were obtained to evaluate the vascular anatomy. RADIATION DOSE REDUCTION: This exam was performed according to the departmental dose-optimization program which includes automated exposure control, adjustment of the mA and/or kV according to patient size and/or use of iterative reconstruction technique. CONTRAST:  75mL OMNIPAQUE  IOHEXOL  350 MG/ML SOLN COMPARISON:  Radiograph earlier today. Most recent chest CTA 09/22/2023, this is the patient's third chest CTA in the last month. FINDINGS: Cardiovascular: There are no filling defects within the pulmonary arteries to suggest pulmonary embolus. Dilated main pulmonary artery at 3.8 cm. The heart is mildly enlarged, chronic. No aortic dissection or acute aortic findings. No pericardial effusion. Mediastinum/Nodes: Again seen subcarinal node/mass on the right, measuring 3.6 cm AP, previously 3.2 cm using same measurement technique. This causes mass effect on the left atrium. Increasing soft  tissue thickening in the anterior mediastinum and prevascular space. No obvious esophageal wall thickening. Lungs/Pleura: Again seen chronic lung disease. There are small bilateral pleural effusions and  thickening that is stable from prior exam. Chronic scarring and bronchiectasis in the upper lungs with subpleural cystic changes, stable. Widespread bilateral peribronchovascular ill-defined nodules and nodularity again seen. Progressive areas of ground-glass in the lower lobes from prior trachea and central airways are clear. Upper Abdomen: Assessed on concurrent abdominopelvic CT, reported separately. Musculoskeletal: Again seen innumerable lytic lesions throughout the skeleton. There is no obvious extra-axial component or encroachment on the spinal canal. Generalized paucity of body fat may represent cachexia. Review of the MIP images confirms the above findings. IMPRESSION: 1. No pulmonary embolus. 2. Bilateral pleural effusions and thickening, stable from prior exam. 3. Chronic scarring and bronchiectasis with upper lobe predominant subpleural cystic change, stable widespread bilateral peribronchovascular ill-defined nodules and nodularity again seen. 4. Progressive areas of ground-glass in the lower lobes from prior exam, may be infectious or inflammatory. 5. Increasing subcarinal node or mass causing mass effect on the left atrium. Increasing soft tissue thickening in the anterior mediastinum and prevascular space, may be neoplastic or inflammatory. 6. Again seen innumerable lytic lesions throughout the skeleton. 7. Dilated main pulmonary artery at 3.8 cm, can be seen with pulmonary arterial hypertension. Electronically Signed   By: Andrea Gasman M.D.   On: 10/09/2023 18:48   US  Renal Result Date: 10/09/2023 CLINICAL DATA:  Hematuria. EXAM: RENAL / URINARY TRACT ULTRASOUND COMPLETE COMPARISON:  September 08, 2023. FINDINGS: Right Kidney: Renal measurements: 10.7 x 4.7 x 5.2 cm = volume: 138 mL. Mildly increased echogenicity of renal parenchyma is noted suggesting medical renal disease. No mass or hydronephrosis visualized. Left Kidney: Renal measurements: 10.1 x 5.6 x 5.2 cm = volume: 155 mL. Mildly  increased echogenicity of renal parenchyma is noted suggesting medical renal disease. No mass or hydronephrosis visualized. Bladder: Mildly thickened bladder wall is noted most likely due to lack of distension, but cystitis cannot be excluded. Other: None. IMPRESSION: 1. Mildly increased echogenicity of renal parenchyma is noted bilaterally suggesting medical renal disease. No hydronephrosis or renal obstruction is noted. 2. Mildly thickened urinary bladder wall is noted most likely due to lack of distension, but cystitis cannot be excluded. Electronically Signed   By: Lynwood Landy Raddle M.D.   On: 10/09/2023 16:09   DG Chest Portable 1 View Result Date: 10/09/2023 CLINICAL DATA:  Shortness of breath EXAM: PORTABLE CHEST 1 VIEW COMPARISON:  September 22, 2023 FINDINGS: Slightly increased patchy opacities at the right lung base and left mid lung compared to prior. Additional chronic interstitial opacities bilaterally. Small bilateral pleural effusions. No pneumothorax. Unchanged cardiomediastinal silhouette. No acute osseous findings. IMPRESSION: Chronic heterogeneous opacities bilaterally with slightly increased patchy opacities in the right lower lung and left mid lung, may represent acute infection/inflammation. Unchanged small bilateral pleural effusions. Electronically Signed   By: Michaeline Blanch M.D.   On: 10/09/2023 14:18          Aylssa Herrig, DO [facility] 10/11/2023, 3:43 PM    Dictation software may have been used to generate the above note. Typos may occur and escape review in typed/dictated notes. Please contact Dr Marsa directly for clarity if needed.  Staff may message me via secure chat in Epic  but this may not receive an immediate response,  please page me for urgent matters!  If 7PM-7AM, please contact night coverage [email or web address]

## 2023-10-11 NOTE — Progress Notes (Signed)
 Daily Progress Note   Patient Name: Warren Farrell       Date: 10/11/2023 DOB: [birth year]  Age: 33 y.o. MRN#: [id number] Attending Physician: Marsa Edelman, DO Primary Care Physician: Freddrick, No Admit Date: 10/09/2023  Reason for Consultation/Follow-up: Establishing goals of care  HPI/Brief Hospital Review: 33 y.o. 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  CBG found to be 52 treated by EMS with improvement only to 56.   In ED remained hyperglycemic, calcium  6.9, ALP 495, albumin  1.9, AST 141, platelets 79--CXR revealing slight increase of patchy opacities in right lower lung and left midlung   CT abdomen IMPRESSION: 1. No renal stones or obstructive uropathy. 2. Suggestion of gastric wall thickening, can be seen with gastritis. 3. Innumerable lucent lesions throughout the skeleton, unchanged from prior. 4. Grossly stable hypodense liver lesions, suboptimally assessed in the absence of IV contrast. 5. Small amount of free fluid in the pelvis, improved from prior. 6. Mild generalized body wall edema. Infiltrative changes in the subcutaneous tissues of the upper thigh, without significant change.   CTA chest IMPRESSION: 1. No pulmonary embolus. 2. Bilateral pleural effusions and thickening, stable from prior exam. 3. Chronic scarring and bronchiectasis with upper lobe predominant subpleural cystic change, stable widespread bilateral peribronchovascular ill-defined nodules and nodularity again seen. 4. Progressive areas of ground-glass in the lower lobes from  prior exam, may be infectious or inflammatory. 5. Increasing subcarinal node or mass causing mass effect on the left atrium. Increasing soft tissue thickening in the anterior mediastinum and prevascular space, may be neoplastic or inflammatory. 6. Again seen innumerable lytic lesions throughout the skeleton. 7. Dilated main pulmonary artery at 3.8 cm, can be seen with pulmonary arterial hypertension.   Noted recent admission 5/16 - 6/18 for P JP pneumonia complicated by Enterococcus and MRSA bacteremia--required intubation   Palliative medicine was consulted for assisting with goals of care conversations.  Subjective: Extensive chart review has been completed prior to meeting patient including labs, vital signs, imaging, progress notes, orders, and available advanced directive documents from current and previous encounters.    Visited with Mr.  Rainone in ICU as he was transferred due to hypotension and hypothermia--concern for further decline.  Mr. Mancha is awake, alert and able to engage in conversation. He is able to recall our visit from yesterday. He shares with me that he remains scared, he acknowledges the advancement of his sarcoma and his overall prognosis--aware treatment/interventions are limited due to his poor functional status as discussed by oncology.  Mr. Hoefle continues to report severe pain, shares he is in constant 10 out of 10 pain, he shares he has become accustomed to suffering and tries to disconnect in order to alleviated his symptoms. We discuss pain regimen, risks associated with aggressively managing pain with use of opioids for which he verbalized understanding.  We again discussed code status and the difference between Full Code and Do Not Resuscitate. Encouraged Mr. Kesling to consider DNR/DNI status understanding evidenced based poor outcomes in similar hospitalized patients, as the cause of the arrest is likely associated with chronic/terminal disease rather than a  reversible acute cardio-pulmonary event.  Mr. eissa buchberger understanding and is agreement with DNR/DNI.  For pain, will start low dose Fentanyl  patch, continue with IV hydromorphone  for breakthrough pain. Will increase dose of fentanyl  patch as appropriate, reasonable to consider PCA if he requires frequent dosing for breakthrough pain.  We discussed end of life expectations, he remains scared regarding the end of his life. He shares faith is important to him and requests a visit from the chaplain. He also shares he enjoys listening to music, encouraged nursing staff to assist in having music playing in his room.  Will attempt to visit bedside once sister visiting to update on plan of care. Called and spoke with sister-LaToya, provided medical updates, discussed changes made to pain regimen as well as discussions has with Eva regarding code status and his decision to transition to DNR/DNI--LaToya appreciative of call and updates, feels DNR status is most appropriate for Mr. Campanelli.  Answered and addressed all questions and concerns. PMT to continue to follow for ongoing needs and support.  Objective:  Physical Exam Constitutional:      General: He is not in acute distress.    Appearance: He is ill-appearing.  Pulmonary:     Effort: Pulmonary effort is normal. No respiratory distress.  Skin:    General: Skin is warm and dry.     Coloration: Skin is not jaundiced.  Neurological:     Mental Status: He is alert and oriented to person, place, and time.     Motor: Weakness present.  Psychiatric:        Mood and Affect: Mood normal. Affect is flat.        Thought Content: Thought content normal.             Vital Signs: BP (!) 100/59   Pulse (!) 106   Temp 98 F (36.7 C) (Oral)   Resp (!) 9   Ht 6' (1.829 m)   Wt 58.9 kg   SpO2 99%   BMI 17.61 kg/m  SpO2: SpO2: 99 % O2 Device: O2 Device: Nasal Cannula O2 Flow Rate: O2 Flow Rate (L/min): 4 L/min   Palliative Care Assessment &  Plan   Assessment/Recommendation/Plan  DNR/DNI Add low dose Fentanyl  patch for pain control PMT to continue to follow for ongoing needs and support  Care plan was discussed with CCM team, primary team and nursing staff.  Thank you for allowing the Palliative Medicine Team to assist in the care of this patient.  Total  time:  65 minutes  Time spent includes: Detailed review of medical records (labs, imaging, vital signs), medically appropriate exam (mental status, respiratory, cardiac, skin), discussed with treatment team, counseling and educating patient, family and staff, documenting clinical information, medication management and coordination of care.  Waddell Lesches, DNP, AGNP-C Palliative Medicine   Please contact Palliative Medicine Team phone at [phone] for questions and concerns.

## 2023-10-11 NOTE — Progress Notes (Signed)
  Echocardiogram 2D Echocardiogram has been performed.  Warren Farrell 10/11/2023, 2:26 PM

## 2023-10-12 DIAGNOSIS — R319 Hematuria, unspecified: Secondary | ICD-10-CM

## 2023-10-12 DIAGNOSIS — R000 Tachycardia, unspecified: Secondary | ICD-10-CM

## 2023-10-12 DIAGNOSIS — Z515 Encounter for palliative care: Secondary | ICD-10-CM | POA: Diagnosis not present

## 2023-10-12 DIAGNOSIS — E46 Unspecified protein-calorie malnutrition: Secondary | ICD-10-CM

## 2023-10-12 DIAGNOSIS — R52 Pain, unspecified: Secondary | ICD-10-CM | POA: Diagnosis not present

## 2023-10-12 DIAGNOSIS — J189 Pneumonia, unspecified organism: Secondary | ICD-10-CM

## 2023-10-12 DIAGNOSIS — E162 Hypoglycemia, unspecified: Secondary | ICD-10-CM

## 2023-10-12 DIAGNOSIS — B20 Human immunodeficiency virus [HIV] disease: Principal | ICD-10-CM

## 2023-10-12 DIAGNOSIS — C469 Kaposi's sarcoma, unspecified: Secondary | ICD-10-CM | POA: Diagnosis not present

## 2023-10-12 DIAGNOSIS — E43 Unspecified severe protein-calorie malnutrition: Secondary | ICD-10-CM

## 2023-10-12 LAB — CBC WITH DIFFERENTIAL/PLATELET
Abs Immature Granulocytes: 0.25 K/uL — ABNORMAL HIGH (ref 0.00–0.07)
Basophils Absolute: 0 K/uL (ref 0.0–0.1)
Basophils Relative: 0 %
Eosinophils Absolute: 0 K/uL (ref 0.0–0.5)
Eosinophils Relative: 0 %
HCT: 31.9 % — ABNORMAL LOW (ref 39.0–52.0)
Hemoglobin: 9.4 g/dL — ABNORMAL LOW (ref 13.0–17.0)
Immature Granulocytes: 5 %
Lymphocytes Relative: 5 %
Lymphs Abs: 0.2 K/uL — ABNORMAL LOW (ref 0.7–4.0)
MCH: 31.6 pg (ref 26.0–34.0)
MCHC: 29.5 g/dL — ABNORMAL LOW (ref 30.0–36.0)
MCV: 107.4 fL — ABNORMAL HIGH (ref 80.0–100.0)
Monocytes Absolute: 0.1 K/uL (ref 0.1–1.0)
Monocytes Relative: 1 %
Neutro Abs: 4.5 K/uL (ref 1.7–7.7)
Neutrophils Relative %: 89 %
Platelets: 31 K/uL — ABNORMAL LOW (ref 150–400)
RBC: 2.97 MIL/uL — ABNORMAL LOW (ref 4.22–5.81)
RDW: 15.8 % — ABNORMAL HIGH (ref 11.5–15.5)
WBC: 5.1 K/uL (ref 4.0–10.5)
nRBC: 4.5 % — ABNORMAL HIGH (ref 0.0–0.2)

## 2023-10-12 LAB — BASIC METABOLIC PANEL WITH GFR
Anion gap: 3 — ABNORMAL LOW (ref 5–15)
BUN: 6 mg/dL (ref 6–20)
CO2: 36 mmol/L — ABNORMAL HIGH (ref 22–32)
Calcium: 7.4 mg/dL — ABNORMAL LOW (ref 8.9–10.3)
Chloride: 97 mmol/L — ABNORMAL LOW (ref 98–111)
Creatinine, Ser: 0.73 mg/dL (ref 0.61–1.24)
GFR, Estimated: >60 mL/min (ref >60)
Glucose, Bld: 163 mg/dL — ABNORMAL HIGH (ref 70–99)
Potassium: 4.5 mmol/L (ref 3.5–5.1)
Sodium: 136 mmol/L (ref 135–145)

## 2023-10-12 MED ORDER — HALOPERIDOL 0.5 MG PO TABS: 0.5000 mg | ORAL_TABLET | ORAL | Status: DC | PRN

## 2023-10-12 MED ORDER — IPRATROPIUM-ALBUTEROL 0.5-2.5 (3) MG/3ML IN SOLN: 3.0000 mL | RESPIRATORY_TRACT | Status: DC | PRN

## 2023-10-12 MED ORDER — ONDANSETRON 4 MG PO TBDP
4.0000 mg | ORAL_TABLET | Freq: Four times a day (QID) | ORAL | Status: DC | PRN
Start: 2023-10-12 — End: 2023-10-13

## 2023-10-12 MED ORDER — ACETAMINOPHEN 325 MG PO TABS
650.0000 mg | ORAL_TABLET | Freq: Four times a day (QID) | ORAL | Status: DC | PRN
Start: 2023-10-12 — End: 2023-10-13

## 2023-10-12 MED ORDER — GLYCOPYRROLATE 0.2 MG/ML IJ SOLN
0.2000 mg | INTRAMUSCULAR | Status: DC | PRN
  Administered 2023-10-12 – 2023-10-13 (×2): 0.2 mg via INTRAVENOUS
  Filled 2023-10-12 (×2): qty 1

## 2023-10-12 MED ORDER — ACETAMINOPHEN 650 MG RE SUPP: 650.0000 mg | Freq: Four times a day (QID) | RECTAL | Status: DC | PRN

## 2023-10-12 MED ORDER — SULFAMETHOXAZOLE-TRIMETHOPRIM 400-80 MG/5ML IV SOLN
20.0000 mg/kg/d | Freq: Three times a day (TID) | INTRAVENOUS | Status: DC
  Filled 2023-10-12: qty 24.54

## 2023-10-12 MED ORDER — GLYCOPYRROLATE 1 MG PO TABS: 1.0000 mg | ORAL_TABLET | ORAL | Status: DC | PRN

## 2023-10-12 MED ORDER — ONDANSETRON HCL 4 MG/2ML IJ SOLN
4.0000 mg | Freq: Four times a day (QID) | INTRAMUSCULAR | Status: DC | PRN
Start: 2023-10-12 — End: 2023-10-13
  Administered 2023-10-12 (×2): 4 mg via INTRAVENOUS
  Filled 2023-10-12 (×2): qty 2

## 2023-10-12 MED ORDER — GLYCOPYRROLATE 0.2 MG/ML IJ SOLN: 0.2000 mg | INTRAMUSCULAR | Status: DC | PRN

## 2023-10-12 MED ORDER — BIOTENE DRY MOUTH MT LIQD
15.0000 mL | OROMUCOSAL | Status: DC | PRN
Start: 2023-10-12 — End: 2023-10-13

## 2023-10-12 MED ORDER — HALOPERIDOL LACTATE 5 MG/ML IJ SOLN
0.5000 mg | INTRAMUSCULAR | Status: DC | PRN
  Administered 2023-10-13: 0.5 mg via INTRAVENOUS
  Filled 2023-10-12: qty 1

## 2023-10-12 MED ORDER — HALOPERIDOL LACTATE 2 MG/ML PO CONC: 0.5000 mg | ORAL | Status: DC | PRN

## 2023-10-12 MED ORDER — POLYVINYL ALCOHOL 1.4 % OP SOLN: 1.0000 [drp] | Freq: Four times a day (QID) | OPHTHALMIC | Status: DC | PRN

## 2023-10-12 MED ORDER — METHYLPREDNISOLONE SODIUM SUCC 40 MG IJ SOLR: 20.0000 mg | Freq: Two times a day (BID) | INTRAMUSCULAR | Status: DC

## 2023-10-12 NOTE — Progress Notes (Signed)
 Initial Nutrition Assessment  DOCUMENTATION CODES:   Underweight  INTERVENTION:   -Continue regular diet -Continue Ensure Plus High Protein po BID, each supplement provides 350 kcal and 20 grams of protein  -Continue 100 mg thiamine  daily -Continue MVI with minerals daily -Continue 1 mg folic acid  daily  NUTRITION DIAGNOSIS:   Increased nutrient needs related to chronic illness (HIV/ AIDS) as evidenced by estimated needs.  GOAL:   Patient will meet greater than or equal to 90% of their needs  MONITOR:   PO intake, Supplement acceptance  REASON FOR ASSESSMENT:   Consult Assessment of nutrition requirement/status, Poor PO  ASSESSMENT:   Pt with  Hx HIV/AIDS and Kaposi sarcoma metastatic to viscera and bone, chronic pain, hx PE (no longer on [facility]), G6PD deficiency, hx PJP pneumonia complicated by Enterococcus and MRSE bacteremia required intubation. Problems with insurance, has not been able to follow w/ oncology / ID, now moved with sister in [place]. Presented on 10/09/23 for hypoglycemia, chest pain, back pain, hematuria, emesis.  Pt admitted with lung disease, probable CAP, and malnutrition.   9/4- revealed evolving lower lung GGO suggesting atypical PNA, no increasing subcarinal and anterior mediastinal masses  Reviewed I/O's: +2.8 L x 24 hours and +5.1 L since admission  UOP: 700 ml x 24 hours  Pt unavailable at time of visit. Attempted to speak with pt via call to hospital room phone, however, unable to reach. RD unable to obtain further nutrition-related history or complete nutrition-focused physical exam at this time.    Per H&P, MD concerned for malnutrition; ordered marinol  BID, MVI, folate, thiamine , albumin , and D5LR.  Marinol  BID  Pt currently on a regular diet. Noted meal completions 50-100%. However, suspect documentation is inaccurate, as pt has been too lethargic to take medications.   Reviewed RD notes from hospitalization from [facility] and June  2025. During that hospitalization, pt had experienced significant wt loss secondary to poor appetite. He was drinking Boost during that hospitalization. Noted pt was identified with severe malnutrition. RD highly suspects this is ongoing, however, unable to identify at this time.   Per palliative care notes, pt is a DNR/DNI. MD recommending comfort measures.   No wt loss noted over the past month.  Medications reviewed and inlcude biktarvy , buspar , vitamin D3, marinol , pepcid , folic acid , neurontin , magnesium  oxide, solu-medrol , MVI, thiamine , dextrose  5% in lactated ringers  infusion @ 125 ml/hr, and effexor .   Labs reviewed: CBGS: 98-126 (inpatient orders for glycemic control are )none.    Diet Order:   Diet Order             Diet regular Room service appropriate? Yes; Fluid consistency: Thin  Diet effective now                   EDUCATION NEEDS:   No education needs have been identified at this time  Skin:  Skin Assessment: Reviewed RN Assessment  Last BM:  10/09/23  Height:   Ht Readings from Last 1 Encounters:  10/09/23 6' (1.829 m)    Weight:   Wt Readings from Last 1 Encounters:  10/11/23 58.9 kg    Ideal Body Weight:  80.9 kg  BMI:  Body mass index is 17.61 kg/m.  Estimated Nutritional Needs:   Kcal:  2150-2350  Protein:  115-130 grams  Fluid:  2.1-2.3 L    Margery ORN, RD, LDN, CDCES Registered Dietitian III Certified Diabetes Care and Education Specialist If unable to reach this RD, please use RD Inpatient group  chat on secure chat between hours of 8am-4 pm daily

## 2023-10-12 NOTE — Plan of Care (Signed)
Patient transitioned to comfort care. 

## 2023-10-12 NOTE — Progress Notes (Signed)
 Daily Progress Note   Patient Name: Warren Farrell      Date: 10/12/2023 DOB: 1990-07-16  Age: 33 y.o. MRN#: [id number] Attending Physician: Marsa Edelman, DO Primary Care Physician: Freddrick, No Admit Date: 10/09/2023  Reason for Consultation/Follow-up: Establishing goals of care  HPI/Brief Hospital Review: 33 y.o. 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  CBG found to be 52 treated by EMS with improvement only to 56.   In ED remained hyperglycemic, calcium  6.9, ALP 495, albumin  1.9, AST 141, platelets 79--CXR revealing slight increase of patchy opacities in right lower lung and left midlung   CT abdomen IMPRESSION: 1. No renal stones or obstructive uropathy. 2. Suggestion of gastric wall thickening, can be seen with gastritis. 3. Innumerable lucent lesions throughout the skeleton, unchanged from prior. 4. Grossly stable hypodense liver lesions, suboptimally assessed in the absence of IV contrast. 5. Small amount of free fluid in the pelvis, improved from prior. 6. Mild generalized body wall edema. Infiltrative changes in the subcutaneous tissues of the upper thigh, without significant change.   CTA chest IMPRESSION: 1. No pulmonary embolus. 2. Bilateral pleural effusions and thickening, stable from prior exam. 3. Chronic scarring and bronchiectasis with upper lobe predominant subpleural cystic change, stable widespread bilateral peribronchovascular ill-defined nodules and nodularity again seen. 4. Progressive areas of ground-glass in the lower lobes from  prior exam, may be infectious or inflammatory. 5. Increasing subcarinal node or mass causing mass effect on the left atrium. Increasing soft tissue thickening in the anterior mediastinum and prevascular space, may be neoplastic or inflammatory. 6. Again seen innumerable lytic lesions throughout the skeleton. 7. Dilated main pulmonary artery at 3.8 cm, can be seen with pulmonary arterial hypertension.   Noted recent admission 5/16 - 6/18 for P JP pneumonia complicated by Enterococcus and MRSA bacteremia--required intubation   Palliative medicine was consulted for assisting with goals of care conversations.  Subjective: Extensive chart review has been completed prior to meeting patient including labs, vital signs, imaging, progress notes, orders, and available advanced directive documents from current and previous encounters.    Visited with Warren Farrell  at his bedside. He is resting in bed comfortably, responds briefly to calling of his name---opens eyes, does not communicate. Noted mild tachycardia, mild labored breathing, pale skin, noted diaphoresis. Patient appears to be actively transitioning to end of life.  Called and spoke with sister-LaToya, provided medial updates regarding overall decompensation, nearing end of life, encouraged LaToya to visit at bedside.  Returned to bedside to visit with Warren Farrell, Warren Farrell again attempts to respond with eye opening but unable to communicate. Dr. Marsa joined at bedside, reviewed medical updates, despite aggressive interventions and treatments Warren Farrell has declined, approaching end of life. LaToya verbalizes understanding.  We discussed comfort measures, Warren Farrell would no longer receive aggressive medical interventions such as continuous vital signs, lab work, radiology testing, or medications not focused on comfort. All care would focus on how the patient is looking and feeling. This would include management of any symptoms that may cause discomfort,  pain, shortness of breath, cough, nausea, agitation, anxiety, and/or secretions etc. Symptoms would be managed with medications and other non-pharmacological interventions such as spiritual support if requested, repositioning, music therapy, or therapeutic listening. Family verbalized understanding and appreciation. LaToya verbalizes understanding and wishes to transition to comfort measures.  Discontinue Fentanyl  patch--concern for oversedation, low threshold to resume based on pain response, change in mentation likely related to transition to end of life. Continue with as needed IV hydromorphone  to maintain comfort.  Answered and addressed all questions and concerns. Anticipate a hospital passing. PMT to continue to follow for ongoing needs and support.    Objective:  Physical Exam Constitutional:      General: He is not in acute distress.    Appearance: He is cachectic. He is ill-appearing and diaphoretic.  Cardiovascular:     Rate and Rhythm: Tachycardia present.  Pulmonary:     Effort: Pulmonary effort is normal. No respiratory distress.  Skin:    General: Skin is warm.     Coloration: Skin is pale.  Neurological:     Mental Status: He is lethargic.             Vital Signs: BP 108/62   Pulse (!) 112   Temp (!) 97.2 F (36.2 C) (Axillary)   Resp 11   Ht 6' (1.829 m)   Wt 58.9 kg   SpO2 98%   BMI 17.61 kg/m  SpO2: SpO2: 98 % O2 Device: O2 Device: Nasal Cannula O2 Flow Rate: O2 Flow Rate (L/min): 4 L/min   Palliative Care Assessment & Plan   Assessment/Recommendation/Plan  DNR/DNI/Comfort  Care plan was discussed with primary team, nursing staff and CCM team  Thank you for allowing the Palliative Medicine Team to assist in the care of this patient.  Total time:  50 minutes  Time spent includes: Detailed review of medical records (labs, imaging, vital signs), medically appropriate exam (mental status, respiratory, cardiac, skin), discussed with treatment team,  counseling and educating patient, family and staff, documenting clinical information, medication management and coordination of care.  Waddell Lesches, DNP, AGNP-C Palliative Medicine   Please contact Palliative Medicine Team phone at [phone] for questions and concerns.

## 2023-10-12 NOTE — Progress Notes (Signed)
 Patient to remain on unit.

## 2023-10-12 NOTE — Progress Notes (Signed)
 Report given to receiving nurse Robyn.

## 2023-10-12 NOTE — Progress Notes (Signed)
 PROGRESS NOTE    Warren Farrell   FMW:[id number] DOB: [birth year]  DOA: 10/09/2023 Date of Service: 10/12/23 which is hospital day 3  PCP: Pcp, No    Hospital course / significant events:   HPI: Warren Farrell is a 33 y.o. 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 Problems with insurance, has not been able to follow w/ oncology / ID. Today 10/09/23 presents to ED [facility] from home for hypoglycemia, chest pain, back pain, hematuria, emesis. Cbg 52, received tube of glucose w/ EMS, improvement to 56.    09/04: to ED. VSS except mild tachypnea to max 26, 100% SpO2 on his baseline 2L [place], Glc 62, hypocalcemia 6.9, ALP elevated 495, hypoalbuminemia 1.9, mild transaminitis AST 141, GFR >60, Hgb 10.4 about baseline, thrombocytopenia Plt 79, UA (+)proteinuria >300, 0-5 RBC/hpf. Troponin neg/flat. Chronic heterogeneous opacities bilaterally with slightly increased patchy opacities in the right lower lung and left mid lung, may represent acute infection/inflammation. Renal US  concerning for medical renal disease. Hospitalist asked to admit. I spoke w/ Dr Babara oncology to confirm that cancer tx would be outpatient anyway so no need for ED to ED transfer to [facility]. Admission for pain control, renal w/u, eval for PE/PNA w/ chest CT, eval abd pain / gross metastasis w/ CT abd/pelv. Pt requested palliative care discussion, full code at this time  09/05: palliative and oncology consults --> pt would like to pursue tx, goal to get him set up w/ [facility]. Poor overall prognosis, desires FULL CODE 09/06: hypotension / hypothermia overnight. Some imrpved w/ bair hugger, IV fluids, albumin . Pending PICC access to run albumin , mg, etc. PCCU consult, suspect may need pressors. I d/w patient re: if BP continues to  drop and his organs start failing, we will not be able to help him with CPR. Attempt w/ pt's permission to call his sister, no answer / no voicemail set up. Palliative care d/w patient further today, decision made for DNR  09/07: continues to have intractable pain, progressively drowsy today despite cutting back on pain rx, diaphoretic, decision made w/ patient/sister for comfort measures only and allow natural death with dignity, focus on pain control.    Consultants:  Palliative care  Medical oncology  PCCU   Procedures/Surgeries: None       ASSESSMENT & PLAN:   Kaposi sarcoma w/ associated metastatic disease including lungs, spine, sternum which is causing chest/back pain  HIV/AIDS  Hypotension Hypothermia Concern for severe SIRS/Sepsis Question PCP pneumonia Acute on Chronic hypoxic respiratory failure. Progressive Hypoglycemia Poor po intake  Echo concerning for RV strain / pulm HTN Hypocalcemia  ALP elevated 495 --> trend down 293 mild transaminitis AST 141 --> WNL Liver involvement / met disease hypoalbuminemia 1.9 --> 1.6 Severe malnutrition  Chronic normocytic anemia Progressive thrombocytopenia Plt 83 --> 41 --> 31  End stage disease w/ progressive pain and organ dysfunction in setting of very poor baseline reserves  Comfort measures only  Titrate pain medications as needed Symptomatic care  No labs  Minimal VS to monitor      DVT prophylaxis: n/a on comfort measures IV fluids: dc on comfort measures Nutrition: diet as tolerated / as desired  Central lines / other devices: none  Code Status: DNR ACP documentation reviewed:  none on file in [facility]  Subjective / Brief ROS:  Patient not responding to questions this morning, very drowsy, opens eyes some to voice   Family Communication: sister at bedside     Objective Findings:  Vitals:   10/12/23 1038 10/12/23 1100 10/12/23 1200 10/12/23 1300  BP:  125/71    Pulse: (!) 114  (!) 112 (!) 109 (!) 143  Resp: (!) 7 (!) 6 10 (!) 7  Temp:      TempSrc:      SpO2: 94% 94% 93% 92%  Weight:      Height:        Intake/Output Summary (Last 24 hours) at 10/12/2023 1534 Last data filed at 10/12/2023 1200 Gross per 24 hour  Intake 1577.43 ml  Output 700 ml  Net 877.43 ml   Filed Weights   10/09/23 1353 10/11/23 1301  Weight: 51.3 kg 58.9 kg    Examination:  Physical Exam Constitutional:      General: He is not in acute distress.    Appearance: He is ill-appearing, toxic-appearing and diaphoretic.  Cardiovascular:     Rate and Rhythm: Regular rhythm. Tachycardia present.  Pulmonary:     Effort: No respiratory distress.  Abdominal:     Palpations: Abdomen is soft.  Skin:    Findings: Lesion and rash present.  Neurological:     Mental Status: He is oriented to person, place, and time. Mental status is at baseline.  Psychiatric:        Mood and Affect: Mood normal.        Behavior: Behavior normal.          Scheduled Medications:   Chlorhexidine  Gluconate Cloth  6 each Topical Daily    Continuous Infusions:    PRN Medications:  acetaminophen  **OR** acetaminophen , antiseptic oral rinse, artificial tears, diphenhydrAMINE , glycopyrrolate  **OR** glycopyrrolate  **OR** glycopyrrolate , haloperidol  **OR** haloperidol  **OR** haloperidol  lactate, HYDROmorphone  (DILAUDID ) injection, ipratropium-albuterol , ondansetron  **OR** ondansetron  (ZOFRAN ) IV  Antimicrobials from admission:  Anti-infectives (From admission, onward)    Start     Dose/Rate Route Frequency Ordered Stop   10/12/23 1400  sulfamethoxazole -trimethoprim  (BACTRIM ) 392.64 mg of trimethoprim  in dextrose  5 % 500 mL IVPB  Status:  Discontinued        20 mg/kg/day of trimethoprim   58.9 kg 349.7 mL/hr over 90 Minutes Intravenous Every 8 hours 10/12/23 1028 10/12/23 1140   10/11/23 1930  sulfamethoxazole -trimethoprim  (BACTRIM  DS) 800-160 MG per tablet 2 tablet  Status:  Discontinued       Note to  Pharmacy: (Monday, Wednesday, Friday)     2 tablet Oral Every 8 hours 10/11/23 1841 10/12/23 1029   10/11/23 1500  doxycycline  (VIBRAMYCIN ) 100 mg in sodium chloride  0.9 % 250 mL IVPB  Status:  Discontinued        100 mg 125 mL/hr over 120 Minutes Intravenous Every 12 hours 10/11/23 1401 10/12/23 1140   10/10/23 1000  bictegravir-emtricitabine -tenofovir  AF (BIKTARVY ) 50-200-25 MG per tablet 1 tablet  Status:  Discontinued        1 tablet Oral Daily 10/09/23 1708 10/12/23 1140   10/10/23 0900  sulfamethoxazole -trimethoprim  (BACTRIM  DS) 800-160 MG per tablet 1 tablet  Status:  Discontinued       Note to Pharmacy: (Monday, Wednesday, Friday)     1 tablet Oral Once per day on Monday Wednesday Friday 10/09/23 1708 10/09/23 1841   10/09/23 2200  sulfamethoxazole -trimethoprim  (BACTRIM  DS) 800-160 MG per tablet 2 tablet  Status:  Discontinued       Note to Pharmacy: (Monday, Wednesday, Friday)  2 tablet Oral Every 8 hours 10/09/23 1841 10/11/23 1401           Data Reviewed:  I have personally reviewed the following...  CBC: Recent Labs  Lab 10/09/23 1347 10/09/23 1939 10/10/23 0417 10/11/23 0453 10/12/23 0323  WBC 5.0 4.6 3.5* 6.9 5.1  NEUTROABS 2.9  --   --  5.6 4.5  HGB 10.4* 10.4* 10.7* 10.2* 9.4*  HCT 33.0* 33.3* 34.7* 34.1* 31.9*  MCV 96.8 98.5 98.6 104.3* 107.4*  PLT 79* 83* 67* 41* 31*   Basic Metabolic Panel: Recent Labs  Lab 10/09/23 1347 10/09/23 1939 10/10/23 0417 10/11/23 0453 10/12/23 0323  NA 137  --  136 135 136  K 3.4*  --  3.4* 4.1 4.5  CL 98  --  98 96* 97*  CO2 28  --  34* 33* 36*  GLUCOSE 62*  --  114* 60* 163*  BUN 11  --  8 6 6   CREATININE 0.46* 0.56* 0.45* 0.75 0.73  CALCIUM  6.9*  --  6.8* 6.9* 7.4*  MG  --   --   --  1.2*  --   PHOS  --   --   --  5.6*  --    GFR: Estimated Creatinine Clearance: 108.4 mL/min (by C-G formula based on SCr of 0.73 mg/dL). Liver Function Tests: Recent Labs  Lab 10/09/23 1347 10/10/23 0417 10/11/23 0453   AST 141* 64* 41  ALT 23 18 14   ALKPHOS 495* 433* 293*  BILITOT 1.8* 1.1 0.8  PROT 8.5* 8.2* 7.4  ALBUMIN  1.9* 1.8* 1.6*   No results for input(s): LIPASE, AMYLASE in the last 168 hours. No results for input(s): AMMONIA in the last 168 hours. Coagulation Profile: No results for input(s): INR, PROTIME in the last 168 hours. Cardiac Enzymes: Recent Labs  Lab 10/11/23 0453  CKTOTAL 76   BNP (last 3 results) No results for input(s): PROBNP in the last 8760 hours. HbA1C: No results for input(s): HGBA1C in the last 72 hours. CBG: Recent Labs  Lab 10/11/23 0038 10/11/23 0440 10/11/23 0543 10/11/23 1258 10/11/23 1601  GLUCAP 124* 62* 79 98 126*   Lipid Profile: No results for input(s): CHOL, HDL, LDLCALC, TRIG, CHOLHDL, LDLDIRECT in the last 72 hours. Thyroid Function Tests: Recent Labs    10/11/23 0453  TSH 0.661   Anemia Panel: Recent Labs    10/11/23 0453  VITAMINB12 427  FOLATE 8.3   Most Recent Urinalysis On File:     Component Value Date/Time   COLORURINE AMBER (A) 10/11/2023 0620   APPEARANCEUR CLEAR (A) 10/11/2023 0620   APPEARANCEUR Clear 07/13/2013 0923   LABSPEC 1.030 10/11/2023 0620   LABSPEC 1.015 07/13/2013 0923   PHURINE 5.0 10/11/2023 0620   GLUCOSEU NEGATIVE 10/11/2023 0620   GLUCOSEU Negative 07/13/2013 0923   HGBUR NEGATIVE 10/11/2023 0620   BILIRUBINUR NEGATIVE 10/11/2023 0620   BILIRUBINUR Negative 07/13/2013 0923   KETONESUR NEGATIVE 10/11/2023 0620   PROTEINUR 100 (A) 10/11/2023 0620   NITRITE NEGATIVE 10/11/2023 0620   LEUKOCYTESUR NEGATIVE 10/11/2023 0620   LEUKOCYTESUR Negative 07/13/2013 0923   Sepsis Labs: @LABRCNTIP (procalcitonin:4,lacticidven:4) Microbiology: Recent Results (from the past 240 hours)  Resp panel by RT-PCR (RSV, Flu A&B, Covid) Anterior Nasal Swab     Status: None   Collection Time: 10/11/23 12:06 PM   Specimen: Anterior Nasal Swab  Result Value Ref Range Status   SARS Coronavirus  2 by RT PCR NEGATIVE NEGATIVE Final   Influenza A by PCR NEGATIVE NEGATIVE Final  Influenza B by PCR NEGATIVE NEGATIVE Final    Comment: (NOTE) The Xpert Xpress SARS-CoV-2/FLU/RSV plus assay is intended as an aid in the diagnosis of influenza from Nasopharyngeal swab specimens and should not be used as a sole basis for treatment. Nasal washings and aspirates are unacceptable for Xpert Xpress SARS-CoV-2/FLU/RSV testing.  Fact Sheet for Patients: BloggerCourse.com  Fact Sheet for Healthcare Providers: SeriousBroker.it  This test is not yet approved or cleared by the [place]  FDA and has been authorized for detection and/or diagnosis of SARS-CoV-2 by FDA under an Emergency Use Authorization (EUA). This EUA will remain in effect (meaning this test can be used) for the duration of the COVID-19 declaration under Section 564(b)(1) of the Act, 21 U.S.C. section 360bbb-3(b)(1), unless the authorization is terminated or revoked.     Resp Syncytial Virus by PCR NEGATIVE NEGATIVE Final    Comment: (NOTE) Fact Sheet for Patients: BloggerCourse.com  Fact Sheet for Healthcare Providers: SeriousBroker.it  This test is not yet approved or cleared by the [place]  FDA and has been authorized for detection and/or diagnosis of SARS-CoV-2 by FDA under an Emergency Use Authorization (EUA). This EUA will remain in effect (meaning this test can be used) for the duration of the COVID-19 declaration under Section 564(b)(1) of the Act, 21 U.S.C. section 360bbb-3(b)(1), unless the authorization is terminated or revoked.  Performed at [facility], [address], [place], [place] [address]   MRSA Next Gen by PCR, Nasal     Status: None   Collection Time: 10/11/23  1:04 PM   Specimen: Nasal Mucosa; Nasal Swab  Result Value Ref Range Status   MRSA by PCR Next Gen NOT DETECTED NOT  DETECTED Final    Comment: (NOTE) The GeneXpert MRSA Assay (FDA approved for NASAL specimens only), is one component of a comprehensive MRSA colonization surveillance program. It is not intended to diagnose MRSA infection nor to guide or monitor treatment for MRSA infections. Test performance is not FDA approved in patients less than 68 years old. Performed at [facility], [address] [place], [place] [address]       Radiology Studies last 3 days: ECHOCARDIOGRAM COMPLETE Result Date: 10/11/2023    ECHOCARDIOGRAM REPORT   Patient Name:   Graylon T Niese Date of Exam: 10/11/2023 Medical Rec #:  [id number]     Height:       72.0 in Accession #:    [id number]    Weight:       129.9 lb Date of Birth:  1990/07/28     BSA:          1.773 m Patient Age:    32 years      BP:           97/56 mmHg Patient Gender: M             HR:           102 bpm. Exam Location:  [facility] Procedure: 2D Echo, Cardiac Doppler and Color Doppler (Both Spectral and Color            Flow Doppler were utilized during procedure). Indications:     Other abnormalities of the heart R00.8  History:         Patient has no prior history of Echocardiogram examinations.  Sonographer:     Thedora Louder RDCS, FASE Referring Phys:  [id number] LANETA BLUNT Diagnosing Phys: Denyse Bathe IMPRESSIONS  1. Left ventricular ejection fraction, by estimation, is 60 to 65%.  The left ventricle has normal function. The left ventricle has no regional wall motion abnormalities. Left ventricular diastolic parameters were normal. There is the interventricular septum is flattened in diastole ('D' shaped left ventricle), consistent with right ventricular volume overload.  2. Right ventricular systolic function is moderately reduced. The right ventricular size is normal.  3. The mitral valve is normal in structure. Trivial mitral valve regurgitation. No evidence of mitral stenosis.  4. The aortic valve is normal in structure. Aortic valve regurgitation  is not visualized. No aortic stenosis is present.  5. The inferior vena cava is normal in size with greater than 50% respiratory variability, suggesting right atrial pressure of 3 mmHg. FINDINGS  Left Ventricle: Left ventricular ejection fraction, by estimation, is 60 to 65%. The left ventricle has normal function. The left ventricle has no regional wall motion abnormalities. Strain was performed and the global longitudinal strain is indeterminate. The left ventricular internal cavity size was normal in size. There is no left ventricular hypertrophy. The interventricular septum is flattened in diastole ('D' shaped left ventricle), consistent with right ventricular volume overload. Left ventricular diastolic parameters were normal. Right Ventricle: The right ventricular size is normal. No increase in right ventricular wall thickness. Right ventricular systolic function is moderately reduced. Left Atrium: Left atrial size was normal in size. Right Atrium: Right atrial size was normal in size. Pericardium: There is no evidence of pericardial effusion. Mitral Valve: The mitral valve is normal in structure. Trivial mitral valve regurgitation. No evidence of mitral valve stenosis. Tricuspid Valve: The tricuspid valve is normal in structure. Tricuspid valve regurgitation is mild . No evidence of tricuspid stenosis. Aortic Valve: The aortic valve is normal in structure. Aortic valve regurgitation is not visualized. No aortic stenosis is present. Aortic valve peak gradient measures 3.5 mmHg. Pulmonic Valve: The pulmonic valve was normal in structure. Pulmonic valve regurgitation is trivial. No evidence of pulmonic stenosis. Aorta: The aortic root is normal in size and structure. Venous: The inferior vena cava is normal in size with greater than 50% respiratory variability, suggesting right atrial pressure of 3 mmHg. IAS/Shunts: No atrial level shunt detected by color flow Doppler. Additional Comments: 3D was performed not  requiring image post processing on an independent workstation and was indeterminate.  LEFT VENTRICLE PLAX 2D LVIDd:         3.80 cm   Diastology LVIDs:         2.70 cm   LV e' medial:    11.90 cm/s LV PW:         0.90 cm   LV E/e' medial:  8.0 LV IVS:        1.10 cm   LV e' lateral:   18.50 cm/s LVOT diam:     2.30 cm   LV E/e' lateral: 5.1 LV SV:         66 LV SV Index:   37 LVOT Area:     4.15 cm  RIGHT VENTRICLE RV Basal diam:  4.70 cm RV S prime:     18.40 cm/s TAPSE (M-mode): 2.5 cm LEFT ATRIUM             Index        RIGHT ATRIUM           Index LA diam:        2.00 cm 1.13 cm/m   RA Area:     15.90 cm LA Vol (A2C):   29.3 ml 16.52 ml/m  RA Volume:  47.30 ml  26.67 ml/m LA Vol (A4C):   8.6 ml  4.85 ml/m LA Biplane Vol: 16.7 ml 9.42 ml/m  AORTIC VALVE                 PULMONIC VALVE AV Area (Vmax): 3.99 cm     PV Vmax:        0.95 m/s AV Vmax:        93.00 cm/s   PV Peak grad:   3.6 mmHg AV Peak Grad:   3.5 mmHg     RVOT Peak grad: 2 mmHg LVOT Vmax:      89.40 cm/s LVOT Vmean:     61.700 cm/s LVOT VTI:       0.158 m  AORTA Ao Root diam: 3.30 cm Ao Asc diam:  2.30 cm MITRAL VALVE               TRICUSPID VALVE MV Area (PHT): 4.49 cm    TR Peak grad:   28.3 mmHg MV Decel Time: 169 msec    TR Vmax:        266.00 cm/s MV E velocity: 94.90 cm/s MV A velocity: 82.80 cm/s  SHUNTS MV E/A ratio:  1.15        Systemic VTI:  0.16 m                            Systemic Diam: 2.30 cm Denyse Bathe Electronically signed by Denyse Bathe Signature Date/Time: 10/11/2023/3:46:00 PM    Final    US  EKG SITE RITE Result Date: 10/11/2023 If Site Rite image not attached, placement could not be confirmed due to current cardiac rhythm.  CT ABDOMEN PELVIS WO CONTRAST Result Date: 10/09/2023 CLINICAL DATA:  Abdominal pain, acute, nonlocalized hematuria, evaluate for stone or other cause, renal US  nonrevealing EXAM: CT ABDOMEN AND PELVIS WITHOUT CONTRAST TECHNIQUE: Multidetector CT imaging of the abdomen and pelvis was performed  following the standard protocol without IV contrast. RADIATION DOSE REDUCTION: This exam was performed according to the departmental dose-optimization program which includes automated exposure control, adjustment of the mA and/or kV according to patient size and/or use of iterative reconstruction technique. COMPARISON:  No ultrasound earlier today. Contrast-enhanced abdominopelvic CT 09/08/2023 FINDINGS: Lower chest: Assessed on chest CTA performed can concurrently, reported separately. Hepatobiliary: Stable hypodense liver lesions, largest measuring 2.6 cm. Direct comparison is limited in the absence of IV contrast. Gallbladder physiologically distended, no calcified stone. No biliary dilatation. Pancreas: Not well assessed in the absence of contrast and paucity of intra-abdominal fat. No obvious inflammation. Spleen: Calcified splenic granulomas. The splenic lesions on prior are not demonstrated in the absence of contrast. No splenomegaly. Adrenals/Urinary Tract: No adrenal nodule. No hydronephrosis or renal calculi. No evidence of renal mass on this unenhanced exam. Partially distended urinary bladder, improved bladder wall thickening from prior. Stomach/Bowel: Suboptimally assessed in the absence of contrast and paucity of intra-abdominal fat. Suggestion of gastric wall thickening. No small bowel distension or evidence of obstruction. Minimal formed stool in the colon. A few colonic diverticula in the descending and sigmoid. Vascular/Lymphatic: Normal caliber abdominal aorta. No evidence of portal venous or mesenteric gas. Adenopathy assessment is limited in the absence of contrast and paucity of body fat. The soft tissue thickening within the upper retroperitoneum on prior is suboptimally assessed on the current exam. Reproductive: Prostate is unremarkable. Other: Small amount of free fluid in the pelvis, improved from prior. Mild generalized body wall edema. Infiltrative  changes in the subcutaneous tissues of  the upper thigh 6, without significant change. Musculoskeletal: Innumerable lucent lesions throughout the skeleton. Small lucent lesion in the right femoral neck. No obvious extraosseous soft tissue component. IMPRESSION: 1. No renal stones or obstructive uropathy. 2. Suggestion of gastric wall thickening, can be seen with gastritis. 3. Innumerable lucent lesions throughout the skeleton, unchanged from prior. 4. Grossly stable hypodense liver lesions, suboptimally assessed in the absence of IV contrast. 5. Small amount of free fluid in the pelvis, improved from prior. 6. Mild generalized body wall edema. Infiltrative changes in the subcutaneous tissues of the upper thigh, without significant change. Electronically Signed   By: Andrea Gasman M.D.   On: 10/09/2023 18:55   CT Angio Chest Pulmonary Embolism (PE) W or WO Contrast Result Date: 10/09/2023 CLINICAL DATA:  Provided history: Cpugh and SOB, known lung complications of kaposi sarcoma, checking for PE EXAM: CT ANGIOGRAPHY CHEST WITH CONTRAST TECHNIQUE: Multidetector CT imaging of the chest was performed using the standard protocol during bolus administration of intravenous contrast. Multiplanar CT image reconstructions and MIPs were obtained to evaluate the vascular anatomy. RADIATION DOSE REDUCTION: This exam was performed according to the departmental dose-optimization program which includes automated exposure control, adjustment of the mA and/or kV according to patient size and/or use of iterative reconstruction technique. CONTRAST:  75mL OMNIPAQUE  IOHEXOL  350 MG/ML SOLN COMPARISON:  Radiograph earlier today. Most recent chest CTA 09/22/2023, this is the patient's third chest CTA in the last month. FINDINGS: Cardiovascular: There are no filling defects within the pulmonary arteries to suggest pulmonary embolus. Dilated main pulmonary artery at 3.8 cm. The heart is mildly enlarged, chronic. No aortic dissection or acute aortic findings. No pericardial  effusion. Mediastinum/Nodes: Again seen subcarinal node/mass on the right, measuring 3.6 cm AP, previously 3.2 cm using same measurement technique. This causes mass effect on the left atrium. Increasing soft tissue thickening in the anterior mediastinum and prevascular space. No obvious esophageal wall thickening. Lungs/Pleura: Again seen chronic lung disease. There are small bilateral pleural effusions and thickening that is stable from prior exam. Chronic scarring and bronchiectasis in the upper lungs with subpleural cystic changes, stable. Widespread bilateral peribronchovascular ill-defined nodules and nodularity again seen. Progressive areas of ground-glass in the lower lobes from prior trachea and central airways are clear. Upper Abdomen: Assessed on concurrent abdominopelvic CT, reported separately. Musculoskeletal: Again seen innumerable lytic lesions throughout the skeleton. There is no obvious extra-axial component or encroachment on the spinal canal. Generalized paucity of body fat may represent cachexia. Review of the MIP images confirms the above findings. IMPRESSION: 1. No pulmonary embolus. 2. Bilateral pleural effusions and thickening, stable from prior exam. 3. Chronic scarring and bronchiectasis with upper lobe predominant subpleural cystic change, stable widespread bilateral peribronchovascular ill-defined nodules and nodularity again seen. 4. Progressive areas of ground-glass in the lower lobes from prior exam, may be infectious or inflammatory. 5. Increasing subcarinal node or mass causing mass effect on the left atrium. Increasing soft tissue thickening in the anterior mediastinum and prevascular space, may be neoplastic or inflammatory. 6. Again seen innumerable lytic lesions throughout the skeleton. 7. Dilated main pulmonary artery at 3.8 cm, can be seen with pulmonary arterial hypertension. Electronically Signed   By: Andrea Gasman M.D.   On: 10/09/2023 18:48   US  Renal Result Date:  10/09/2023 CLINICAL DATA:  Hematuria. EXAM: RENAL / URINARY TRACT ULTRASOUND COMPLETE COMPARISON:  September 08, 2023. FINDINGS: Right Kidney: Renal measurements: 10.7 x 4.7 x 5.2 cm =  volume: 138 mL. Mildly increased echogenicity of renal parenchyma is noted suggesting medical renal disease. No mass or hydronephrosis visualized. Left Kidney: Renal measurements: 10.1 x 5.6 x 5.2 cm = volume: 155 mL. Mildly increased echogenicity of renal parenchyma is noted suggesting medical renal disease. No mass or hydronephrosis visualized. Bladder: Mildly thickened bladder wall is noted most likely due to lack of distension, but cystitis cannot be excluded. Other: None. IMPRESSION: 1. Mildly increased echogenicity of renal parenchyma is noted bilaterally suggesting medical renal disease. No hydronephrosis or renal obstruction is noted. 2. Mildly thickened urinary bladder wall is noted most likely due to lack of distension, but cystitis cannot be excluded. Electronically Signed   By: Lynwood Landy Raddle M.D.   On: 10/09/2023 16:09   DG Chest Portable 1 View Result Date: 10/09/2023 CLINICAL DATA:  Shortness of breath EXAM: PORTABLE CHEST 1 VIEW COMPARISON:  September 22, 2023 FINDINGS: Slightly increased patchy opacities at the right lung base and left mid lung compared to prior. Additional chronic interstitial opacities bilaterally. Small bilateral pleural effusions. No pneumothorax. Unchanged cardiomediastinal silhouette. No acute osseous findings. IMPRESSION: Chronic heterogeneous opacities bilaterally with slightly increased patchy opacities in the right lower lung and left mid lung, may represent acute infection/inflammation. Unchanged small bilateral pleural effusions. Electronically Signed   By: Michaeline Blanch M.D.   On: 10/09/2023 14:18          Khali Albanese, DO [facility] 10/12/2023, 3:34 PM    Dictation software may have been used to generate the above note. Typos may occur and escape review in  typed/dictated notes. Please contact Dr Marsa directly for clarity if needed.  Staff may message me via secure chat in Epic  but this may not receive an immediate response,  please page me for urgent matters!  If 7PM-7AM, please contact night coverage [email or web address]

## 2023-10-12 NOTE — Progress Notes (Signed)
 NAME:  YACINE GARRIGA, MRN:  [id number], DOB:  1990-07-15, LOS: 3 ADMISSION DATE:  10/09/2023, CONSULTATION DATE:  10/11/23 REFERRING MD:  Laneta Marsa COME CHIEF COMPLAINT:  Sarcoma   History of Present Illness:  BENIAH MAGNAN is a 33 y.o. 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 Problems with insurance, has not been able to follow w/ oncology / ID, [place]. Presented to ED on 10/09/23 from home for hypoglycemia, chest pain, back pain, hematuria, emesis.   Pertinent  Medical History  As above  Significant Hospital Events: Including procedures, antibiotic start and stop dates in addition to other pertinent events   9/6 Transfer to ICU 9/7 dying process  Interim History / Subjective:  Patient lethargic Now DNR/DNI  Patient in pain Dying process  Objective    Blood pressure 108/62, pulse (!) 112, temperature (!) 97.2 F (36.2 C), temperature source Axillary, resp. rate 11, height 6' (1.829 m), weight 58.9 kg, SpO2 98%.        Intake/Output Summary (Last 24 hours) at 10/12/2023 1037 Last data filed at 10/12/2023 0900 Gross per 24 hour  Intake 3492.66 ml  Output 700 ml  Net 2792.66 ml   Filed Weights   10/09/23 1353 10/11/23 1301  Weight: 51.3 kg 58.9 kg    Examination: General: cachexia, somnolence HENT: Nodules over neck, PICC in place, poor dentition Lungs: scattered crackles bilateral, poor respiratory effort Neuro: lethargic GU: incontinence  Resolved problem list  N/a  Assessment and Plan   END STAGE HIV/AIDS WITH METASTATIC KAPOSI'S  Patient with resp distress and progressive failure to thrive Patient is now DNR/DNI Patient is suffering and in the dying process - wean oxygen for sats >91%, currently on 4L [place] (baseline RA)   Pain - palliative following, appreciate  recommendations regarding GOC (transitioned to DNAR status) and pain mgmt - fentanyl  12.5 mg q3day, lidoderm , prn dilaudid  q2h for breakthrough pain - continue tid gabapentin   RECOMMEND COMFORT CARE MEASURES  Labs   CBC: Recent Labs  Lab 10/09/23 1347 10/09/23 1939 10/10/23 0417 10/11/23 0453 10/12/23 0323  WBC 5.0 4.6 3.5* 6.9 5.1  NEUTROABS 2.9  --   --  5.6 4.5  HGB 10.4* 10.4* 10.7* 10.2* 9.4*  HCT 33.0* 33.3* 34.7* 34.1* 31.9*  MCV 96.8 98.5 98.6 104.3* 107.4*  PLT 79* 83* 67* 41* 31*    Basic Metabolic Panel: Recent Labs  Lab 10/09/23 1347 10/09/23 1939 10/10/23 0417 10/11/23 0453 10/12/23 0323  NA 137  --  136 135 136  K 3.4*  --  3.4* 4.1 4.5  CL 98  --  98 96* 97*  CO2 28  --  34* 33* 36*  GLUCOSE 62*  --  114* 60* 163*  BUN 11  --  8 6 6   CREATININE 0.46* 0.56* 0.45* 0.75 0.73  CALCIUM  6.9*  --  6.8* 6.9* 7.4*  MG  --   --   --  1.2*  --   PHOS  --   --   --  5.6*  --    GFR: Estimated Creatinine Clearance: 108.4 mL/min (by C-G formula based on SCr of 0.73 mg/dL). Recent Labs  Lab 10/09/23 1939 10/10/23 0417 10/11/23 0453 10/12/23 0323  WBC 4.6 3.5* 6.9 5.1    Liver Function Tests: Recent Labs  Lab 10/09/23 1347 10/10/23 0417  10/11/23 0453  AST 141* 64* 41  ALT 23 18 14   ALKPHOS 495* 433* 293*  BILITOT 1.8* 1.1 0.8  PROT 8.5* 8.2* 7.4  ALBUMIN  1.9* 1.8* 1.6*   No results for input(s): LIPASE, AMYLASE in the last 168 hours. No results for input(s): AMMONIA in the last 168 hours.  ABG    Component Value Date/Time   PHART 7.36 10/11/2023 0445   PCO2ART 65 (H) 10/11/2023 0445   PO2ART 100 10/11/2023 0445   HCO3 36.7 (H) 10/11/2023 0445   O2SAT 99.6 10/11/2023 0445     Coagulation Profile: No results for input(s): INR, PROTIME in the last 168 hours.  Cardiac Enzymes: Recent Labs  Lab 10/11/23 0453  CKTOTAL 76    HbA1C: No results found for: HGBA1C  CBG: Recent Labs  Lab 10/11/23 0038 10/11/23 0440  10/11/23 0543 10/11/23 1258 10/11/23 1601  GLUCAP 124* 62* 79 98 126*     DVT/GI PRX  assessed I Assessed the need for Labs I Assessed the need for Foley I Assessed the need for Central Venous Line Family Discussion when available I Assessed the need for Mobilization I made an Assessment of medications to be adjusted accordingly Safety Risk assessment completed  CASE DISCUSSED IN MULTIDISCIPLINARY ROUNDS WITH ICU TEAM     Critical Care Time devoted to patient care services described in this note is 55 minutes.  Critical care was necessary to treat /prevent imminent and life-threatening deterioration. Overall, patient is critically ill, prognosis is guarded.  Patient with Multiorgan failure and at high risk for cardiac arrest and death.    Nickolas Alm Cellar, M.D.  [facility]  Medical Director [facility] Medical Director [facility] Cardio-Pulmonary Department

## 2023-10-12 NOTE — Plan of Care (Signed)
  Problem: Clinical Measurements: Goal: Ability to maintain clinical measurements within normal limits will improve Outcome: Progressing Goal: Will remain free from infection Outcome: Progressing Goal: Diagnostic test results will improve Outcome: Not Progressing Goal: Respiratory complications will improve Outcome: Progressing Goal: Cardiovascular complication will be avoided Outcome: Progressing   Problem: Coping: Goal: Level of anxiety will decrease Outcome: Progressing   Problem: Safety: Goal: Ability to remain free from injury will improve Outcome: Progressing   Problem: Skin Integrity: Goal: Risk for impaired skin integrity will decrease Outcome: Progressing

## 2023-10-12 NOTE — Progress Notes (Signed)
 Patient remains on comfort care, family at bedside.  Will endorse to oncoming shift.

## 2023-10-12 NOTE — Progress Notes (Signed)
 During the course of this shift the patient has made a significant decline from status this morning. At the beginning of the shift patient was able to voice needs.  Patient became significantly lethargic, with increase in heart rate, hard to arouse, and audible gurgling noted on auscultation.  Palliative at bedside and reached out to the patient's sister regarding significant change in status; provider also notified.  Patient's sister came to bedside and received updates from palliative and provider there.  At this time patient's sister has decided to move forward with making the patient comfortable and voiced this to provider and palliative.  Chaplain called to bedside and comfort care tray ordered.

## 2023-10-13 DIAGNOSIS — Q821 Xeroderma pigmentosum: Secondary | ICD-10-CM

## 2023-10-13 DIAGNOSIS — Z515 Encounter for palliative care: Secondary | ICD-10-CM | POA: Diagnosis not present

## 2023-10-13 DIAGNOSIS — C469 Kaposi's sarcoma, unspecified: Secondary | ICD-10-CM | POA: Diagnosis not present

## 2023-10-13 DIAGNOSIS — R531 Weakness: Secondary | ICD-10-CM | POA: Diagnosis not present

## 2023-10-13 DIAGNOSIS — E162 Hypoglycemia, unspecified: Secondary | ICD-10-CM | POA: Diagnosis not present

## 2023-10-13 MED ORDER — HYDROMORPHONE HCL 1 MG/ML IJ SOLN
0.5000 mg | INTRAMUSCULAR | Status: DC | PRN
  Administered 2023-10-13 (×5): 0.5 mg via INTRAVENOUS
  Filled 2023-10-13 (×5): qty 1

## 2023-10-14 LAB — HELPER T-LYMPH-CD4 (ARMC ONLY)
% CD 4 Pos. Lymph.: 7.1 % — ABNORMAL LOW (ref 30.8–58.5)
Absolute CD 4 Helper: 7 /uL — ABNORMAL LOW (ref 359–1519)
Basophils Absolute: 0.1 x10E3/uL (ref 0.0–0.2)
Basos: 1 %
EOS (ABSOLUTE): 0 x10E3/uL (ref 0.0–0.4)
Eos: 0 %
Hematocrit: 30.2 % — ABNORMAL LOW (ref 37.5–51.0)
Hemoglobin: 9.3 g/dL — ABNORMAL LOW (ref 13.0–17.7)
Lymphocytes Absolute: 0.1 x10E3/uL — ABNORMAL LOW (ref 0.7–3.1)
Lymphs: 1 %
MCH: 30.8 pg (ref 26.6–33.0)
MCHC: 30.8 g/dL — ABNORMAL LOW (ref 31.5–35.7)
MCV: 100 fL — ABNORMAL HIGH (ref 79–97)
Monocytes Absolute: 0 x10E3/uL — ABNORMAL LOW (ref 0.1–0.9)
Monocytes: 0 %
NRBC: 7 % — ABNORMAL HIGH (ref 0–0)
Neutrophils Absolute: 5 x10E3/uL (ref 1.4–7.0)
Neutrophils: 98 %
Platelets: 31 x10E3/uL — ABNORMAL LOW (ref 150–450)
RBC: 3.02 x10E6/uL — ABNORMAL LOW (ref 4.14–5.80)
RDW: 13.3 % (ref 11.6–15.4)
WBC: 5.1 x10E3/uL (ref 3.4–10.8)

## 2023-10-16 ENCOUNTER — Encounter: Payer: Self-pay | Admitting: Osteopathic Medicine

## 2023-11-05 NOTE — Death Summary Note (Signed)
 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 Problems with insurance, has not been able to follow w/ oncology / ID. Today October 17, 2023 presents to ED [facility] from home for hypoglycemia, chest pain, back pain, hematuria, emesis. Cbg 52, received tube of glucose w/ EMS, improvement to 56Sep 12, 2025: to ED. VSS except mild tachypnea to max 26, 100% SpO2 on his baseline 2L [place], Glc 62, hypocalcemia 6.9, ALP elevated 495, hypoalbuminemia 1.9, mild transaminitis AST 141, GFR >60, Hgb 10.4 about baseline, thrombocytopenia Plt 79, UA (+)proteinuria >300, 0-5 RBC/hpf. Troponin neg/flat. Chronic heterogeneous opacities bilaterally with slightly increased patchy opacities in the right lower lung and left mid lung, may represent acute infection/inflammation. Renal US  concerning for medical renal disease. Hospitalist asked to admit. I spoke w/ Dr Babara oncology to confirm that cancer tx would be outpatient anyway so no need for ED to ED transfer to [facility]. Admission for pain control, renal w/u, eval for PE/PNA w/ chest CT, eval abd pain / gross  metastasis w/ CT abd/pelv. Pt requested palliative care discussion, full code at this time  09/05: palliative and oncology consults --> pt would like to pursue tx, goal to get him set up w/ [facility]. Poor overall prognosis, desires FULL CODE 09/06: hypotension / hypothermia overnight. Some imrpved w/ bair hugger, IV fluids, albumin . Pending PICC access to run albumin , mg, etc. PCCU consult, suspect may need pressors. Decision made for DNR  09/07: continues to have intractable pain, progressively drowsy today despite cutting back on pain rx, diaphoretic, decision made w/ patient/sister for comfort measures only and allow natural death with dignity, focus on pain control.  10-21-2023: patient passed peacefully   Consultants:  Palliative care  Medical oncology  PCCU   Procedures/Surgeries: None       ASSESSMENT & PLAN:   Kaposi sarcoma w/ associated metastatic disease including lungs, bones, likely liver; this is causing intractable pain and multiorgan failure  HIV/AIDS  Hypotension, hypotensive shock  Hypothermia Concern for severe SIRS/Sepsis Question PCP pneumonia Acute on Chronic hypoxic respiratory failure. Progressive in setting pf severe metastatic lung disease and question PCP pneumonia Hypoglycemia Poor po intake  Echo concerning for RV strain / pulm HTN Hypocalcemia  ALP elevated 495 --> trend down 293 mild transaminitis AST 141 --> WNL Liver involvement / met disease hypoalbuminemia 1.9 --> 1.6 Severe malnutrition  Chronic macrocytic anemia Progressive thrombocytopenia Plt 83 --> 41 --> 31  End stage disease w/ progressive pain and organ dysfunction in setting of very poor baseline reserves  Comfort measures only following aggressive treatment (antibiotics, fluids, BP support, temperature support) which was not resulting in improvement  Nutrition Documentation    Flowsheet Row ED to Hosp-Admission (Current) from 10/09/2023 in [facility]  ICU/CCU  Nutrition Problem Increased nutrient needs  Etiology chronic illness  [HIV/ AIDS]  Nutrition Goal Patient will meet greater than or equal to 90% of their needs  Interventions Ensure Enlive (each supplement provides 350kcal and 20 grams of protein), MVI  ,  (Optional):26781}    The results of significant diagnostics from this hospitalization (including imaging, microbiology, ancillary and laboratory) are listed below for reference.   Significant Diagnostic Studies: ECHOCARDIOGRAM COMPLETE Result Date: 10/11/2023    ECHOCARDIOGRAM REPORT   Patient Name:   Warren Farrell Date of Exam: 10/11/2023 Medical Rec #:  [id number]     Height:       72.0 in Accession #:    [id number]    Weight:       129.9 lb Date of Birth:  [birth year]     BSA:          1.773 m Patient Age:    32 years      BP:           97/56 mmHg Patient Gender: M             HR:           102 bpm. Exam Location:  [facility] Procedure: 2D Echo, Cardiac Doppler and Color Doppler (Both Spectral and Color            Flow Doppler were utilized during procedure). Indications:     Other abnormalities of the heart R00.8  History:         Patient has no prior history of Echocardiogram examinations.  Sonographer:     Thedora Louder RDCS, FASE Referring Phys:  [id number] LANETA BLUNT Diagnosing Phys: Denyse Bathe IMPRESSIONS  1. Left ventricular ejection fraction, by estimation, is 60 to 65%. The left ventricle has normal function. The left ventricle has no regional wall motion abnormalities. Left ventricular diastolic parameters were normal. There is the interventricular septum is flattened in diastole ('D' shaped left ventricle), consistent with right ventricular volume overload.  2. Right ventricular systolic function is moderately reduced. The right ventricular size is normal.  3. The mitral valve is normal in structure. Trivial mitral valve regurgitation. No evidence of mitral stenosis.  4. The aortic valve is normal in structure. Aortic valve  regurgitation is not visualized. No aortic stenosis is present.  5. The inferior vena cava is normal in size with greater than 50% respiratory variability, suggesting right atrial pressure of 3 mmHg. FINDINGS  Left Ventricle: Left ventricular ejection fraction, by estimation, is 60 to 65%. The left ventricle has normal function. The left ventricle has no regional wall motion abnormalities. Strain was performed and the global longitudinal strain is indeterminate. The left ventricular internal cavity size was normal in size. There is no left ventricular hypertrophy. The interventricular septum is flattened in diastole ('D' shaped left ventricle), consistent with right ventricular volume overload. Left ventricular diastolic parameters were normal. Right Ventricle: The right ventricular size is normal. No increase in right ventricular wall thickness. Right ventricular systolic function is moderately reduced. Left Atrium: Left atrial size was normal in size. Right Atrium: Right atrial size was normal in size. Pericardium: There is no evidence of pericardial effusion. Mitral Valve: The mitral valve is normal in structure. Trivial mitral valve regurgitation. No evidence of mitral valve stenosis. Tricuspid Valve: The tricuspid valve is normal in structure. Tricuspid valve regurgitation is mild . No evidence  of tricuspid stenosis. Aortic Valve: The aortic valve is normal in structure. Aortic valve regurgitation is not visualized. No aortic stenosis is present. Aortic valve peak gradient measures 3.5 mmHg. Pulmonic Valve: The pulmonic valve was normal in structure. Pulmonic valve regurgitation is trivial. No evidence of pulmonic stenosis. Aorta: The aortic root is normal in size and structure. Venous: The inferior vena cava is normal in size with greater than 50% respiratory variability, suggesting right atrial pressure of 3 mmHg. IAS/Shunts: No atrial level shunt detected by color flow Doppler. Additional Comments: 3D was  performed not requiring image post processing on an independent workstation and was indeterminate.  LEFT VENTRICLE PLAX 2D LVIDd:         3.80 cm   Diastology LVIDs:         2.70 cm   LV e' medial:    11.90 cm/s LV PW:         0.90 cm   LV E/e' medial:  8.0 LV IVS:        1.10 cm   LV e' lateral:   18.50 cm/s LVOT diam:     2.30 cm   LV E/e' lateral: 5.1 LV SV:         66 LV SV Index:   37 LVOT Area:     4.15 cm  RIGHT VENTRICLE RV Basal diam:  4.70 cm RV S prime:     18.40 cm/s TAPSE (M-mode): 2.5 cm LEFT ATRIUM             Index        RIGHT ATRIUM           Index LA diam:        2.00 cm 1.13 cm/m   RA Area:     15.90 cm LA Vol (A2C):   29.3 ml 16.52 ml/m  RA Volume:   47.30 ml  26.67 ml/m LA Vol (A4C):   8.6 ml  4.85 ml/m LA Biplane Vol: 16.7 ml 9.42 ml/m  AORTIC VALVE                 PULMONIC VALVE AV Area (Vmax): 3.99 cm     PV Vmax:        0.95 m/s AV Vmax:        93.00 cm/s   PV Peak grad:   3.6 mmHg AV Peak Grad:   3.5 mmHg     RVOT Peak grad: 2 mmHg LVOT Vmax:      89.40 cm/s LVOT Vmean:     61.700 cm/s LVOT VTI:       0.158 m  AORTA Ao Root diam: 3.30 cm Ao Asc diam:  2.30 cm MITRAL VALVE               TRICUSPID VALVE MV Area (PHT): 4.49 cm    TR Peak grad:   28.3 mmHg MV Decel Time: 169 msec    TR Vmax:        266.00 cm/s MV E velocity: 94.90 cm/s MV A velocity: 82.80 cm/s  SHUNTS MV E/A ratio:  1.15        Systemic VTI:  0.16 m                            Systemic Diam: 2.30 cm Denyse Bathe Electronically signed by Denyse Bathe Signature Date/Time: 10/11/2023/3:46:00 PM    Final    US  EKG SITE RITE Result Date: 10/11/2023 If Site Rite  image not attached, placement could not be confirmed due to current cardiac rhythm.  CT ABDOMEN PELVIS WO CONTRAST Result Date: 10/09/2023 CLINICAL DATA:  Abdominal pain, acute, nonlocalized hematuria, evaluate for stone or other cause, renal US  nonrevealing EXAM: CT ABDOMEN AND PELVIS WITHOUT CONTRAST TECHNIQUE: Multidetector CT imaging of the abdomen and pelvis  was performed following the standard protocol without IV contrast. RADIATION DOSE REDUCTION: This exam was performed according to the departmental dose-optimization program which includes automated exposure control, adjustment of the mA and/or kV according to patient size and/or use of iterative reconstruction technique. COMPARISON:  No ultrasound earlier today. Contrast-enhanced abdominopelvic CT 09/08/2023 FINDINGS: Lower chest: Assessed on chest CTA performed can concurrently, reported separately. Hepatobiliary: Stable hypodense liver lesions, largest measuring 2.6 cm. Direct comparison is limited in the absence of IV contrast. Gallbladder physiologically distended, no calcified stone. No biliary dilatation. Pancreas: Not well assessed in the absence of contrast and paucity of intra-abdominal fat. No obvious inflammation. Spleen: Calcified splenic granulomas. The splenic lesions on prior are not demonstrated in the absence of contrast. No splenomegaly. Adrenals/Urinary Tract: No adrenal nodule. No hydronephrosis or renal calculi. No evidence of renal mass on this unenhanced exam. Partially distended urinary bladder, improved bladder wall thickening from prior. Stomach/Bowel: Suboptimally assessed in the absence of contrast and paucity of intra-abdominal fat. Suggestion of gastric wall thickening. No small bowel distension or evidence of obstruction. Minimal formed stool in the colon. A few colonic diverticula in the descending and sigmoid. Vascular/Lymphatic: Normal caliber abdominal aorta. No evidence of portal venous or mesenteric gas. Adenopathy assessment is limited in the absence of contrast and paucity of body fat. The soft tissue thickening within the upper retroperitoneum on prior is suboptimally assessed on the current exam. Reproductive: Prostate is unremarkable. Other: Small amount of free fluid in the pelvis, improved from prior. Mild generalized body wall edema. Infiltrative changes in the  subcutaneous tissues of the upper thigh 6, without significant change. Musculoskeletal: Innumerable lucent lesions throughout the skeleton. Small lucent lesion in the right femoral neck. No obvious extraosseous soft tissue component. IMPRESSION: 1. No renal stones or obstructive uropathy. 2. Suggestion of gastric wall thickening, can be seen with gastritis. 3. Innumerable lucent lesions throughout the skeleton, unchanged from prior. 4. Grossly stable hypodense liver lesions, suboptimally assessed in the absence of IV contrast. 5. Small amount of free fluid in the pelvis, improved from prior. 6. Mild generalized body wall edema. Infiltrative changes in the subcutaneous tissues of the upper thigh, without significant change. Electronically Signed   By: Andrea Gasman M.D.   On: 10/09/2023 18:55   CT Angio Chest Pulmonary Embolism (PE) W or WO Contrast Result Date: 10/09/2023 CLINICAL DATA:  Provided history: Cpugh and SOB, known lung complications of kaposi sarcoma, checking for PE EXAM: CT ANGIOGRAPHY CHEST WITH CONTRAST TECHNIQUE: Multidetector CT imaging of the chest was performed using the standard protocol during bolus administration of intravenous contrast. Multiplanar CT image reconstructions and MIPs were obtained to evaluate the vascular anatomy. RADIATION DOSE REDUCTION: This exam was performed according to the departmental dose-optimization program which includes automated exposure control, adjustment of the mA and/or kV according to patient size and/or use of iterative reconstruction technique. CONTRAST:  75mL OMNIPAQUE  IOHEXOL  350 MG/ML SOLN COMPARISON:  Radiograph earlier today. Most recent chest CTA 09/22/2023, this is the patient's third chest CTA in the last month. FINDINGS: Cardiovascular: There are no filling defects within the pulmonary arteries to suggest pulmonary embolus. Dilated main pulmonary artery at 3.8 cm. The  heart is mildly enlarged, chronic. No aortic dissection or acute aortic  findings. No pericardial effusion. Mediastinum/Nodes: Again seen subcarinal node/mass on the right, measuring 3.6 cm AP, previously 3.2 cm using same measurement technique. This causes mass effect on the left atrium. Increasing soft tissue thickening in the anterior mediastinum and prevascular space. No obvious esophageal wall thickening. Lungs/Pleura: Again seen chronic lung disease. There are small bilateral pleural effusions and thickening that is stable from prior exam. Chronic scarring and bronchiectasis in the upper lungs with subpleural cystic changes, stable. Widespread bilateral peribronchovascular ill-defined nodules and nodularity again seen. Progressive areas of ground-glass in the lower lobes from prior trachea and central airways are clear. Upper Abdomen: Assessed on concurrent abdominopelvic CT, reported separately. Musculoskeletal: Again seen innumerable lytic lesions throughout the skeleton. There is no obvious extra-axial component or encroachment on the spinal canal. Generalized paucity of body fat may represent cachexia. Review of the MIP images confirms the above findings. IMPRESSION: 1. No pulmonary embolus. 2. Bilateral pleural effusions and thickening, stable from prior exam. 3. Chronic scarring and bronchiectasis with upper lobe predominant subpleural cystic change, stable widespread bilateral peribronchovascular ill-defined nodules and nodularity again seen. 4. Progressive areas of ground-glass in the lower lobes from prior exam, may be infectious or inflammatory. 5. Increasing subcarinal node or mass causing mass effect on the left atrium. Increasing soft tissue thickening in the anterior mediastinum and prevascular space, may be neoplastic or inflammatory. 6. Again seen innumerable lytic lesions throughout the skeleton. 7. Dilated main pulmonary artery at 3.8 cm, can be seen with pulmonary arterial hypertension. Electronically Signed   By: Andrea Gasman M.D.   On: 10/09/2023 18:48    US  Renal Result Date: 10/09/2023 CLINICAL DATA:  Hematuria. EXAM: RENAL / URINARY TRACT ULTRASOUND COMPLETE COMPARISON:  September 08, 2023. FINDINGS: Right Kidney: Renal measurements: 10.7 x 4.7 x 5.2 cm = volume: 138 mL. Mildly increased echogenicity of renal parenchyma is noted suggesting medical renal disease. No mass or hydronephrosis visualized. Left Kidney: Renal measurements: 10.1 x 5.6 x 5.2 cm = volume: 155 mL. Mildly increased echogenicity of renal parenchyma is noted suggesting medical renal disease. No mass or hydronephrosis visualized. Bladder: Mildly thickened bladder wall is noted most likely due to lack of distension, but cystitis cannot be excluded. Other: None. IMPRESSION: 1. Mildly increased echogenicity of renal parenchyma is noted bilaterally suggesting medical renal disease. No hydronephrosis or renal obstruction is noted. 2. Mildly thickened urinary bladder wall is noted most likely due to lack of distension, but cystitis cannot be excluded. Electronically Signed   By: Lynwood Landy Raddle M.D.   On: 10/09/2023 16:09   DG Chest Portable 1 View Result Date: 10/09/2023 CLINICAL DATA:  Shortness of breath EXAM: PORTABLE CHEST 1 VIEW COMPARISON:  September 22, 2023 FINDINGS: Slightly increased patchy opacities at the right lung base and left mid lung compared to prior. Additional chronic interstitial opacities bilaterally. Small bilateral pleural effusions. No pneumothorax. Unchanged cardiomediastinal silhouette. No acute osseous findings. IMPRESSION: Chronic heterogeneous opacities bilaterally with slightly increased patchy opacities in the right lower lung and left mid lung, may represent acute infection/inflammation. Unchanged small bilateral pleural effusions. Electronically Signed   By: Michaeline Blanch M.D.   On: 10/09/2023 14:18   CT Angio Chest PE W and/or Wo Contrast Result Date: 09/22/2023 CLINICAL DATA:  Chest pain EXAM: CT ANGIOGRAPHY CHEST WITH CONTRAST TECHNIQUE: Multidetector CT imaging  of the chest was performed using the standard protocol during bolus administration of intravenous contrast. Multiplanar CT image  reconstructions and MIPs were obtained to evaluate the vascular anatomy. RADIATION DOSE REDUCTION: This exam was performed according to the departmental dose-optimization program which includes automated exposure control, adjustment of the mA and/or kV according to patient size and/or use of iterative reconstruction technique. CONTRAST:  53mL OMNIPAQUE  IOHEXOL  350 MG/ML SOLN COMPARISON:  09/22/2023 chest x-ray, chest CT 09/08/2023, 08/08/2022 FINDINGS: Cardiovascular: Satisfactory opacification of the pulmonary arteries to the segmental level. No evidence of pulmonary embolism. Nonaneurysmal aorta. No dissection. Mediastinum/Nodes: Patent trachea. No suspicious thyroid mass. Stable soft tissue thickening within the mediastinum/pre-vascular space. Subcarinal node or mass measures 3.2 cm, previously 3.2 cm when measured in similar fashion. Esophagus is unremarkable Lungs/Pleura: Similar small complex pleural effusions or pleural disease. Chronic scarring and bronchiectasis in the upper lungs with upper lobe predominant subpleural cystic changes, stable compared with recent CT earlier this month, progressive cystic changes compared with July exam. Widespread bilateral peribronchovascular ill-defined nodules and nodularity. Similar appearance of ground-glass density in the right greater than left lower lobes. No pneumothorax. Upper Abdomen: Hypodense liver lesions are grossly similar. Musculoskeletal: Numerous lucent lesions within the sternum and spine consistent with metastatic disease. Review of the MIP images confirms the above findings. IMPRESSION: 1. Negative for acute pulmonary embolus or aortic dissection. 2. Similar appearance of small complex pleural effusions or pleural disease. 3. Chronic scarring and bronchiectasis in the upper lungs with upper lobe predominant subpleural cystic  changes, stable compared with recent CT earlier this month, progressive cystic changes compared with July exam. Widespread bilateral peribronchovascular ill-defined nodules and nodularity, similar compared with recent CT earlier this month. Similar appearance of ground-glass density in the right greater than left lower lobes either due to infection, inflammatory process, or metastatic disease. 4. Stable soft tissue thickening within the mediastinum/pre-vascular space and stable enlarged subcarinal node or mass consistent with metastatic disease. 5. Numerous lucent lesions within the sternum and spine consistent with metastatic disease. 6. Hypodense liver lesions are grossly similar. Electronically Signed   By: Luke Bun M.D.   On: 09/22/2023 17:57   DG Chest 2 View Result Date: 09/22/2023 CLINICAL DATA:  chest pain. EXAM: CHEST - 2 VIEW COMPARISON:  09/08/2023. FINDINGS: Redemonstration of heterogeneous nonspecific opacities throughout bilateral lungs with upper lobe predominance. No significant interval change. No acute consolidation in the interim. Redemonstration of bilateral small pleural effusions without significant interval change as well. Surgical suture noted overlying the right lung apex. There is bilateral apical smooth opacification, which may represent pleural thickening versus effusion. Stable cardio-mediastinal silhouette. No acute osseous abnormalities. The soft tissues are within normal limits. IMPRESSION: No significant interval change since the prior study. Redemonstration of heterogeneous nonspecific opacities throughout bilateral lungs with upper lobe predominance. No acute consolidation in the interim. Redemonstration of bilateral small pleural effusions without significant interval change as well. Electronically Signed   By: Ree Molt M.D.   On: 09/22/2023 12:54    Microbiology: Recent Results (from the past 240 hours)  Resp panel by RT-PCR (RSV, Flu A&B, Covid) Anterior Nasal  Swab     Status: None   Collection Time: 10/11/23 12:06 PM   Specimen: Anterior Nasal Swab  Result Value Ref Range Status   SARS Coronavirus 2 by RT PCR NEGATIVE NEGATIVE Final   Influenza A by PCR NEGATIVE NEGATIVE Final   Influenza B by PCR NEGATIVE NEGATIVE Final    Comment: (NOTE) The Xpert Xpress SARS-CoV-2/FLU/RSV plus assay is intended as an aid in the diagnosis of influenza from Nasopharyngeal swab specimens and should  not be used as a sole basis for treatment. Nasal washings and aspirates are unacceptable for Xpert Xpress SARS-CoV-2/FLU/RSV testing.  Fact Sheet for Patients: BloggerCourse.com  Fact Sheet for Healthcare Providers: SeriousBroker.it  This test is not yet approved or cleared by the [place]  FDA and has been authorized for detection and/or diagnosis of SARS-CoV-2 by FDA under an Emergency Use Authorization (EUA). This EUA will remain in effect (meaning this test can be used) for the duration of the COVID-19 declaration under Section 564(b)(1) of the Act, 21 U.S.C. section 360bbb-3(b)(1), unless the authorization is terminated or revoked.     Resp Syncytial Virus by PCR NEGATIVE NEGATIVE Final    Comment: (NOTE) Fact Sheet for Patients: BloggerCourse.com  Fact Sheet for Healthcare Providers: SeriousBroker.it  This test is not yet approved or cleared by the [place]  FDA and has been authorized for detection and/or diagnosis of SARS-CoV-2 by FDA under an Emergency Use Authorization (EUA). This EUA will remain in effect (meaning this test can be used) for the duration of the COVID-19 declaration under Section 564(b)(1) of the Act, 21 U.S.C. section 360bbb-3(b)(1), unless the authorization is terminated or revoked.  Performed at [facility], [address], [place], [place] [address]   MRSA Next Gen by PCR, Nasal     Status: None    Collection Time: 10/11/23  1:04 PM   Specimen: Nasal Mucosa; Nasal Swab  Result Value Ref Range Status   MRSA by PCR Next Gen NOT DETECTED NOT DETECTED Final    Comment: (NOTE) The GeneXpert MRSA Assay (FDA approved for NASAL specimens only), is one component of a comprehensive MRSA colonization surveillance program. It is not intended to diagnose MRSA infection nor to guide or monitor treatment for MRSA infections. Test performance is not FDA approved in patients less than 66 years old. Performed at [facility], [address] [place], [place] [address]     Time spent: 30 minutes  Signed: Laneta Blunt, DO 10-31-23

## 2023-11-05 NOTE — Progress Notes (Addendum)
 Palliative Care Progress Note, Assessment & Plan   Patient Name: Warren Farrell       Date: October 29, 2023 DOB: 03-Feb-1991  Age: 33 y.o. MRN#: [id number] Attending Physician: Marsa Edelman, DO Primary Care Physician: Freddrick, No Admit Date: 10/09/2023  Subjective: Unable to assess  HPI: Per previous HPI: 33 y.o. 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  CBG found to be 52 treated by EMS with improvement only to 56.   In ED remained hyperglycemic, calcium  6.9, ALP 495, albumin  1.9, AST 141, platelets 79--CXR revealing slight increase of patchy opacities in right lower lung and left midlung   CT abdomen IMPRESSION: 1. No renal stones or obstructive uropathy. 2. Suggestion of gastric wall thickening, can be seen with gastritis. 3. Innumerable lucent lesions throughout the skeleton, unchanged from prior. 4. Grossly stable hypodense liver lesions, suboptimally assessed in the absence of IV contrast. 5. Small amount of free fluid in the pelvis, improved from prior. 6. Mild generalized body wall edema. Infiltrative changes in the subcutaneous tissues of the upper thigh, without significant change.   CTA chest IMPRESSION: 1. No pulmonary embolus. 2. Bilateral pleural effusions and thickening, stable from prior exam. 3. Chronic scarring and bronchiectasis with upper lobe predominant subpleural cystic change, stable widespread bilateral peribronchovascular ill-defined nodules and nodularity again seen. 4. Progressive areas of ground-glass in the lower lobes from prior exam, may be  infectious or inflammatory. 5. Increasing subcarinal node or mass causing mass effect on the left atrium. Increasing soft tissue thickening in the anterior mediastinum and prevascular space, may be neoplastic or inflammatory. 6. Again seen innumerable lytic lesions throughout the skeleton. 7. Dilated main pulmonary artery at 3.8 cm, can be seen with pulmonary arterial hypertension.   Noted recent admission 5/16 - 6/18 for P JP pneumonia complicated by Enterococcus and MRSA bacteremia--required intubation   Palliative medicine was consulted for assisting with goals of care conversations.    Summary of counseling/coordination of care: Extensive chart review completed prior to meeting patient including labs, vital signs, imaging, progress notes, orders, and available advanced directive documents from current and previous encounters.   After reviewing the patient's chart I assessed patient at bedside.  Unresponsiveness, cachectic male lying in bed.  He appears to be actively dying with respirations at 6/min.  He is in no distress.  There is no family at bedside.  Patient is comfort care at this time.    Physical Exam Vitals reviewed.  Constitutional:      Appearance: He is ill-appearing.     Comments: Cachectic  HENT:     Head: Normocephalic and atraumatic.     Mouth/Throat:     Mouth: Mucous membranes are dry.  Pulmonary:     Comments: RR at 6/min   Recommendations/Plan: DNR-comfort Anticipate hospital death       Total Time 35 minutes   Discussed plan of care with primary RN.  Time spent includes: Detailed review of medical records (labs, imaging, vital signs), medically appropriate  exam (mental status, respiratory, cardiac, skin), discussed with treatment team, counseling and educating patient, family and staff, documenting clinical information, medication management and coordination of care.     Devere Sacks, AMANDA [facility] Palliative Medicine Team  10-28-2023 12:50  PM  Office [phone]  Pager [phone]

## 2023-11-05 DEATH — deceased
